# Patient Record
Sex: Female | Born: 1940 | Race: White | Hispanic: No | Marital: Married | Smoking: Current every day smoker
Health system: Southern US, Community
[De-identification: ages and names within clinical notes are randomized; demographics above are authoritative.]

## PROBLEM LIST (undated history)

## (undated) DIAGNOSIS — M1990 Unspecified osteoarthritis, unspecified site: Secondary | ICD-10-CM

## (undated) DIAGNOSIS — E785 Hyperlipidemia, unspecified: Secondary | ICD-10-CM

## (undated) DIAGNOSIS — D249 Benign neoplasm of unspecified breast: Secondary | ICD-10-CM

## (undated) DIAGNOSIS — D369 Benign neoplasm, unspecified site: Secondary | ICD-10-CM

## (undated) DIAGNOSIS — H9192 Unspecified hearing loss, left ear: Secondary | ICD-10-CM

## (undated) DIAGNOSIS — Z789 Other specified health status: Secondary | ICD-10-CM

## (undated) HISTORY — DX: Benign neoplasm, unspecified site: D36.9

## (undated) HISTORY — DX: Hyperlipidemia, unspecified: E78.5

## (undated) HISTORY — DX: Benign neoplasm of unspecified breast: D24.9

## (undated) HISTORY — PX: BREAST EXCISIONAL BIOPSY: SUR124

## (undated) HISTORY — PX: EYE SURGERY: SHX253

## (undated) HISTORY — PX: INNER EAR SURGERY: SHX679

## (undated) HISTORY — DX: Unspecified osteoarthritis, unspecified site: M19.90

---
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
## 1966-04-25 HISTORY — PX: ECTOPIC PREGNANCY SURGERY: SHX613

## 1969-04-25 HISTORY — PX: ABDOMINAL HYSTERECTOMY: SHX81

## 1978-04-25 HISTORY — PX: GANGLION CYST EXCISION: SHX1691

## 1998-02-04 ENCOUNTER — Other Ambulatory Visit: Admission: RE | Admit: 1998-02-04 | Discharge: 1998-02-04 | Payer: Self-pay | Admitting: Obstetrics & Gynecology

## 1999-02-03 ENCOUNTER — Other Ambulatory Visit: Admission: RE | Admit: 1999-02-03 | Discharge: 1999-02-03 | Payer: Self-pay | Admitting: Obstetrics and Gynecology

## 2000-01-06 ENCOUNTER — Ambulatory Visit (HOSPITAL_COMMUNITY): Admission: RE | Admit: 2000-01-06 | Discharge: 2000-01-06 | Payer: Self-pay | Admitting: Gastroenterology

## 2000-02-21 ENCOUNTER — Other Ambulatory Visit: Admission: RE | Admit: 2000-02-21 | Discharge: 2000-02-21 | Payer: Self-pay | Admitting: Obstetrics and Gynecology

## 2001-02-28 ENCOUNTER — Other Ambulatory Visit: Admission: RE | Admit: 2001-02-28 | Discharge: 2001-02-28 | Payer: Self-pay | Admitting: Obstetrics and Gynecology

## 2001-07-10 ENCOUNTER — Encounter: Payer: Self-pay | Admitting: Family Medicine

## 2001-07-10 ENCOUNTER — Ambulatory Visit: Admission: RE | Admit: 2001-07-10 | Discharge: 2001-07-10 | Payer: Self-pay | Admitting: Family Medicine

## 2002-03-08 ENCOUNTER — Other Ambulatory Visit: Admission: RE | Admit: 2002-03-08 | Discharge: 2002-03-08 | Payer: Self-pay | Admitting: Obstetrics and Gynecology

## 2002-12-28 ENCOUNTER — Emergency Department (HOSPITAL_COMMUNITY): Admission: EM | Admit: 2002-12-28 | Discharge: 2002-12-28 | Payer: Self-pay | Admitting: Emergency Medicine

## 2004-03-04 ENCOUNTER — Encounter: Admission: RE | Admit: 2004-03-04 | Discharge: 2004-06-02 | Payer: Self-pay | Admitting: Family Medicine

## 2004-04-12 ENCOUNTER — Ambulatory Visit (HOSPITAL_COMMUNITY): Admission: RE | Admit: 2004-04-12 | Discharge: 2004-04-12 | Payer: Self-pay | Admitting: Obstetrics and Gynecology

## 2005-03-29 ENCOUNTER — Other Ambulatory Visit: Admission: RE | Admit: 2005-03-29 | Discharge: 2005-03-29 | Payer: Self-pay | Admitting: Obstetrics and Gynecology

## 2011-05-01 ENCOUNTER — Ambulatory Visit (INDEPENDENT_AMBULATORY_CARE_PROVIDER_SITE_OTHER): Payer: MEDICARE

## 2011-05-01 DIAGNOSIS — S1093XA Contusion of unspecified part of neck, initial encounter: Secondary | ICD-10-CM

## 2011-05-01 DIAGNOSIS — S0003XA Contusion of scalp, initial encounter: Secondary | ICD-10-CM

## 2011-08-23 ENCOUNTER — Encounter (INDEPENDENT_AMBULATORY_CARE_PROVIDER_SITE_OTHER): Payer: Self-pay | Admitting: Surgery

## 2011-08-23 ENCOUNTER — Ambulatory Visit (INDEPENDENT_AMBULATORY_CARE_PROVIDER_SITE_OTHER): Payer: MEDICARE | Admitting: Surgery

## 2011-08-23 DIAGNOSIS — D369 Benign neoplasm, unspecified site: Secondary | ICD-10-CM | POA: Insufficient documentation

## 2011-08-23 HISTORY — DX: Benign neoplasm, unspecified site: D36.9

## 2011-08-23 NOTE — Progress Notes (Signed)
NAME: Laura Rush DOB: 07/30/1940 MRN: [id number]                                                                                      DATE: 08/23/2011  PCP: HARRIS, WILLIAM, MD, MD Referring Provider: Pavic, Dag, MD  IMPRESSION:  Intraductal papilloma right breast  PLAN:   NL excision. I have discussed the indications for the lumpectomy and described the procedure. She understand that the chance of removal of the abnormal area is very good, but that occasionally we are unable to locate it and may have to do a second procedure. We also discussed the possibility of a second procedure to get additional tissue. Risks of surgery such as bleeding and infection have also been explained, as well as the implications of not doing the surgery. She understands and wishes to proceed.                  CC:  Chief Complaint  Patient presents with  . New Evaluation    Breast papilloma    HPI:  Laura Rush is a 71 y.o.  female who presents for evaluation of She recently had a mammogram and an abnormality was found in the subareolar region on the right. A core biopsy was done and this appears to be a papilloma. Excisional biopsy was recommended.  The patient has never had any problems with her breasts other than some cysts many years ago. She has no current breast symptoms. She's not had any nipple discharge. She has no family history significant for breast cancer.  PMH:  has a past medical history of Papilloma of breast; Arthritis; and Hyperlipidemia.  PSH:   has past surgical history that includes Appendectomy (1949); Tonsillectomy (1949); Abdominal hysterectomy (1971); Inner ear surgery (1970 or 1971); Ganglion cyst excision (1980); and Ectopic pregnancy surgery (1968).  ALLERGIES:   Allergies  Allergen Reactions  . Ampicillin Hives and Rash    All over the body  . Penicillins Hives and Rash    All over the body  . Aspirin Itching    All over the body  . Codeine Other (See Comments)   Numbness of lips  . Red Dye Rash    All over the body  . Sulfur Rash    All over the body    MEDICATIONS: Current outpatient prescriptions:RESTASIS 0.05 % ophthalmic emulsion, Twice daily. Both eyes, Disp: , Rfl:   ROS: Negative except for some arthralgias  EXAM:   GENERAL:  The patient is alert, oriented, and generally healthy-appearing, NAD. Mood and affect are normal.  HEENT:  The head is normocephalic, the eyes nonicteric, the pupils were round regular and equal. EOMs are normal. Pharynx normal. Dentition good.  NECK:  The neck is supple and there are no masses or thyromegaly.  LUNGS: Normal respirations and clear to auscultation.  HEART: Regular rhythm, with no murmurs rubs or gallops. Pulses are intact carotid dorsalis pedis and posterior tibial. No significant varicosities are noted.  BREASTS:  Symmetric. No mass on either side. No nipple discharge. Not tender.  LYMPHATICS No axillary or supraclavicular adenopathy.  ABDOMEN: Soft, flat, and nontender. No masses or organomegaly is   noted. No hernias are noted. Bowel sounds are normal.  EXTREMITIES:  Good range of motion, no edema.   DATA REVIEWED:  Mammogram reports and films and path report noted    Daya Dutt J 08/23/2011  CC: Pavic, Dag, MD, HARRIS, WILLIAM, MD, MD        

## 2011-08-23 NOTE — Patient Instructions (Signed)
We will schedule surgery to remove the small mas - papilloma - from the right nipple area

## 2011-09-05 ENCOUNTER — Encounter (INDEPENDENT_AMBULATORY_CARE_PROVIDER_SITE_OTHER): Payer: Self-pay

## 2011-09-14 ENCOUNTER — Encounter (HOSPITAL_BASED_OUTPATIENT_CLINIC_OR_DEPARTMENT_OTHER): Payer: Self-pay | Admitting: *Deleted

## 2011-09-14 NOTE — Progress Notes (Signed)
Pt's father passed away-has been out of town-no labs needed

## 2011-09-15 ENCOUNTER — Encounter (HOSPITAL_BASED_OUTPATIENT_CLINIC_OR_DEPARTMENT_OTHER)
Admission: RE | Disposition: A | Discharge status: Home / Self Care | Payer: Self-pay | Source: Ambulatory Visit | Attending: Surgery

## 2011-09-15 ENCOUNTER — Encounter (HOSPITAL_BASED_OUTPATIENT_CLINIC_OR_DEPARTMENT_OTHER): Payer: Self-pay | Admitting: Anesthesiology

## 2011-09-15 ENCOUNTER — Ambulatory Visit (HOSPITAL_BASED_OUTPATIENT_CLINIC_OR_DEPARTMENT_OTHER): Payer: Medicare Other | Admitting: Anesthesiology

## 2011-09-15 ENCOUNTER — Ambulatory Visit (HOSPITAL_BASED_OUTPATIENT_CLINIC_OR_DEPARTMENT_OTHER)
Admission: RE | Admit: 2011-09-15 | Discharge: 2011-09-15 | Disposition: A | Discharge status: Home / Self Care | Payer: Medicare Other | Source: Ambulatory Visit | Attending: Surgery | Admitting: Surgery

## 2011-09-15 ENCOUNTER — Encounter (HOSPITAL_BASED_OUTPATIENT_CLINIC_OR_DEPARTMENT_OTHER): Payer: Self-pay

## 2011-09-15 DIAGNOSIS — D249 Benign neoplasm of unspecified breast: Secondary | ICD-10-CM

## 2011-09-15 HISTORY — PX: BREAST BIOPSY: SHX20

## 2011-09-15 HISTORY — DX: Unspecified hearing loss, left ear: H91.92

## 2011-09-15 HISTORY — DX: Other specified health status: Z78.9

## 2011-09-15 LAB — POCT HEMOGLOBIN-HEMACUE: Hemoglobin: 13.4 g/dL (ref 12.0–15.0)

## 2011-09-15 SURGERY — BREAST BIOPSY WITH NEEDLE LOCALIZATION
Anesthesia: General | Site: Breast | Laterality: Right | Wound class: Clean

## 2011-09-15 MED ORDER — LIDOCAINE HCL (CARDIAC) 20 MG/ML IV SOLN
INTRAVENOUS | Status: DC | PRN
  Administered 2011-09-15: 80 mg via INTRAVENOUS

## 2011-09-15 MED ORDER — CHLORHEXIDINE GLUCONATE 4 % EX LIQD: 1.0000 "application " | Freq: Once | CUTANEOUS | Status: DC

## 2011-09-15 MED ORDER — PROPOFOL 10 MG/ML IV EMUL
INTRAVENOUS | Status: DC | PRN
  Administered 2011-09-15: 200 mg via INTRAVENOUS

## 2011-09-15 MED ORDER — LACTATED RINGERS IV SOLN
INTRAVENOUS | Status: DC
  Administered 2011-09-15: 09:00:00 via INTRAVENOUS

## 2011-09-15 MED ORDER — CIPROFLOXACIN IN D5W 400 MG/200ML IV SOLN
400.0000 mg | INTRAVENOUS | Status: AC
  Administered 2011-09-15: 400 mg via INTRAVENOUS

## 2011-09-15 MED ORDER — ONDANSETRON HCL 4 MG/2ML IJ SOLN
INTRAMUSCULAR | Status: DC | PRN
  Administered 2011-09-15: 4 mg via INTRAVENOUS

## 2011-09-15 MED ORDER — METOCLOPRAMIDE HCL 5 MG/ML IJ SOLN: 10.0000 mg | Freq: Once | INTRAMUSCULAR | Status: DC | PRN

## 2011-09-15 MED ORDER — DEXAMETHASONE SODIUM PHOSPHATE 4 MG/ML IJ SOLN
INTRAMUSCULAR | Status: DC | PRN
  Administered 2011-09-15: 10 mg via INTRAVENOUS

## 2011-09-15 MED ORDER — HYDROCODONE-ACETAMINOPHEN 5-325 MG PO TABS: 1.0000 | ORAL_TABLET | ORAL | Status: AC | PRN

## 2011-09-15 MED ORDER — EPHEDRINE SULFATE 50 MG/ML IJ SOLN
INTRAMUSCULAR | Status: DC | PRN
  Administered 2011-09-15: 10 mg via INTRAVENOUS

## 2011-09-15 MED ORDER — ACETAMINOPHEN 10 MG/ML IV SOLN
1000.0000 mg | Freq: Once | INTRAVENOUS | Status: AC
  Administered 2011-09-15: 1000 mg via INTRAVENOUS

## 2011-09-15 MED ORDER — HYDROMORPHONE HCL PF 1 MG/ML IJ SOLN: 0.2500 mg | INTRAMUSCULAR | Status: DC | PRN

## 2011-09-15 MED ORDER — FENTANYL CITRATE 0.05 MG/ML IJ SOLN
INTRAMUSCULAR | Status: DC | PRN
  Administered 2011-09-15: 25 ug via INTRAVENOUS
  Administered 2011-09-15: 50 ug via INTRAVENOUS

## 2011-09-15 MED ORDER — BUPIVACAINE HCL (PF) 0.25 % IJ SOLN
INTRAMUSCULAR | Status: DC | PRN
  Administered 2011-09-15: 20 mL

## 2011-09-15 SURGICAL SUPPLY — 48 items
ADH SKN CLS APL DERMABOND .7 (GAUZE/BANDAGES/DRESSINGS) ×1
APPLICATOR COTTON TIP 6IN STRL (MISCELLANEOUS) IMPLANT
BINDER BREAST LRG (GAUZE/BANDAGES/DRESSINGS) IMPLANT
BINDER BREAST MEDIUM (GAUZE/BANDAGES/DRESSINGS) IMPLANT
BINDER BREAST XLRG (GAUZE/BANDAGES/DRESSINGS) IMPLANT
BINDER BREAST XXLRG (GAUZE/BANDAGES/DRESSINGS) IMPLANT
BLADE HEX COATED 2.75 (ELECTRODE) ×2 IMPLANT
BLADE SURG 15 STRL LF DISP TIS (BLADE) ×1 IMPLANT
BLADE SURG 15 STRL SS (BLADE) ×2
CANISTER SUCTION 1200CC (MISCELLANEOUS) ×2 IMPLANT
CHLORAPREP W/TINT 26ML (MISCELLANEOUS) ×2 IMPLANT
CLIP TI MEDIUM 6 (CLIP) IMPLANT
CLIP TI WIDE RED SMALL 6 (CLIP) IMPLANT
CLOTH BEACON ORANGE TIMEOUT ST (SAFETY) ×2 IMPLANT
COVER MAYO STAND STRL (DRAPES) ×2 IMPLANT
COVER TABLE BACK 60X90 (DRAPES) ×2 IMPLANT
DECANTER SPIKE VIAL GLASS SM (MISCELLANEOUS) IMPLANT
DERMABOND ADVANCED (GAUZE/BANDAGES/DRESSINGS) ×1
DERMABOND ADVANCED .7 DNX12 (GAUZE/BANDAGES/DRESSINGS) ×1 IMPLANT
DEVICE DUBIN W/COMP PLATE 8390 (MISCELLANEOUS) ×1 IMPLANT
DRAPE LAPAROTOMY TRNSV 102X78 (DRAPE) ×2 IMPLANT
DRAPE UTILITY XL STRL (DRAPES) ×2 IMPLANT
ELECT REM PT RETURN 9FT ADLT (ELECTROSURGICAL) ×2
ELECTRODE REM PT RTRN 9FT ADLT (ELECTROSURGICAL) ×1 IMPLANT
GLOVE BIOGEL M STRL SZ7.5 (GLOVE) ×1 IMPLANT
GLOVE BIOGEL PI IND STRL 8 (GLOVE) IMPLANT
GLOVE BIOGEL PI INDICATOR 8 (GLOVE) ×1
GLOVE EUDERMIC 7 POWDERFREE (GLOVE) ×2 IMPLANT
GOWN PREVENTION PLUS XLARGE (GOWN DISPOSABLE) ×2 IMPLANT
KIT MARKER MARGIN INK (KITS) IMPLANT
NDL HYPO 25X1 1.5 SAFETY (NEEDLE) ×1 IMPLANT
NEEDLE HYPO 25X1 1.5 SAFETY (NEEDLE) ×2 IMPLANT
NS IRRIG 1000ML POUR BTL (IV SOLUTION) IMPLANT
PACK BASIN DAY SURGERY FS (CUSTOM PROCEDURE TRAY) ×2 IMPLANT
PENCIL BUTTON HOLSTER BLD 10FT (ELECTRODE) ×2 IMPLANT
SLEEVE SCD COMPRESS KNEE MED (MISCELLANEOUS) ×2 IMPLANT
SPONGE GAUZE 4X4 12PLY (GAUZE/BANDAGES/DRESSINGS) IMPLANT
SPONGE INTESTINAL PEANUT (DISPOSABLE) IMPLANT
SPONGE LAP 4X18 X RAY DECT (DISPOSABLE) ×2 IMPLANT
STAPLER VISISTAT 35W (STAPLE) IMPLANT
SUT MNCRL AB 4-0 PS2 18 (SUTURE) ×2 IMPLANT
SUT SILK 0 TIES 10X30 (SUTURE) IMPLANT
SUT VICRYL 3-0 CR8 SH (SUTURE) ×2 IMPLANT
SYR CONTROL 10ML LL (SYRINGE) ×2 IMPLANT
TOWEL OR NON WOVEN STRL DISP B (DISPOSABLE) ×2 IMPLANT
TUBE CONNECTING 20X1/4 (TUBING) ×2 IMPLANT
WATER STERILE IRR 1000ML POUR (IV SOLUTION) ×1 IMPLANT
YANKAUER SUCT BULB TIP NO VENT (SUCTIONS) ×2 IMPLANT

## 2011-09-15 NOTE — Anesthesia Postprocedure Evaluation (Signed)
Anesthesia Post Note  Patient: Laura Rush  Procedure(s) Performed: Procedure(s) (LRB): BREAST BIOPSY WITH NEEDLE LOCALIZATION (Right)  Anesthesia type: General  Patient location: PACU  Post pain: Pain level controlled  Post assessment: Patient's Cardiovascular Status Stable  Last Vitals:  Filed Vitals:   09/15/11 1216  BP: 149/78  Pulse: 62  Temp: 35.6 C  Resp: 18    Post vital signs: Reviewed and stable  Level of consciousness: alert  Complications: No apparent anesthesia complications

## 2011-09-15 NOTE — Anesthesia Preprocedure Evaluation (Signed)
Anesthesia Evaluation  Patient identified by MRN, date of birth, ID band Patient awake    Reviewed: Allergy & Precautions, H&P , NPO status , Patient's Chart, lab work & pertinent test results, reviewed documented beta blocker date and time   Airway Mallampati: II TM Distance: >3 FB Neck ROM: full    Dental   Pulmonary Current Smoker,          Cardiovascular negative cardio ROS      Neuro/Psych negative neurological ROS  negative psych ROS   GI/Hepatic negative GI ROS, Neg liver ROS,   Endo/Other  negative endocrine ROS  Renal/GU negative Renal ROS  negative genitourinary   Musculoskeletal   Abdominal   Peds  Hematology negative hematology ROS (+)   Anesthesia Other Findings See surgeon's H&P   Reproductive/Obstetrics negative OB ROS                           Anesthesia Physical Anesthesia Plan  ASA: II  Anesthesia Plan: General   Post-op Pain Management:    Induction: Intravenous  Airway Management Planned: LMA  Additional Equipment:   Intra-op Plan:   Post-operative Plan: Extubation in OR  Informed Consent: I have reviewed the patients History and Physical, chart, labs and discussed the procedure including the risks, benefits and alternatives for the proposed anesthesia with the patient or authorized representative who has indicated his/her understanding and acceptance.   Dental Advisory Given  Plan Discussed with: CRNA and Surgeon  Anesthesia Plan Comments:         Anesthesia Quick Evaluation  

## 2011-09-15 NOTE — Interval H&P Note (Signed)
History and Physical Interval Note:  09/15/2011 10:33 AM  Laura Rush  has presented today for surgery, with the diagnosis of right breast mass  The various methods of treatment have been discussed with the patient and family. After consideration of risks, benefits and other options for treatment, the patient has consented to  Procedure(s) (LRB): BREAST BIOPSY WITH NEEDLE LOCALIZATION (Right) as a surgical intervention .  The patients' history has been reviewed, patient examined, no change in status, stable for surgery.  I have reviewed the patients' chart and labs.  Questions were answered to the patient's satisfaction.  The right breast is marked as the operative site and the wire localizing films are reviewed   Mellie Buccellato J

## 2011-09-15 NOTE — Transfer of Care (Signed)
Immediate Anesthesia Transfer of Care Note  Patient: Laura Rush  Procedure(s) Performed: Procedure(s) (LRB): BREAST BIOPSY WITH NEEDLE LOCALIZATION (Right)  Patient Location: PACU  Anesthesia Type: General  Level of Consciousness: awake  Airway & Oxygen Therapy: Patient Spontanous Breathing and Patient connected to face mask oxygen  Post-op Assessment: Report given to PACU RN and Post -op Vital signs reviewed and stable  Post vital signs: Reviewed and stable  Complications: No apparent anesthesia complications

## 2011-09-15 NOTE — H&P (View-Only) (Signed)
Laura Rush DOB: 1940/10/29 MRN: [id number]                                                                                      DATE: 08/23/2011  PCP: Laura Blamer, MD, MD Referring Provider: Dominga Ferry, MD  IMPRESSION:  Intraductal papilloma right breast  PLAN:   NL excision. I have discussed the indications for the lumpectomy and described the procedure. She understand that the chance of removal of the abnormal area is very good, but that occasionally we are unable to locate it and may have to do a second procedure. We also discussed the possibility of a second procedure to get additional tissue. Risks of surgery such as bleeding and infection have also been explained, as well as the implications of not doing the surgery. She understands and wishes to proceed.                  CC:  Chief Complaint  Patient presents with  . New Evaluation    Breast papilloma    HPI:  Laura Rush is a 71 y.o.  female who presents for evaluation of She recently had a mammogram and an abnormality was found in the subareolar region on the right. A core biopsy was done and this appears to be a papilloma. Excisional biopsy was recommended.  The patient has never had any problems with her breasts other than some cysts many years ago. She has no current breast symptoms. She's not had any nipple discharge. She has no family history significant for breast cancer.  PMH:  has a past medical history of Papilloma of breast; Arthritis; and Hyperlipidemia.  PSH:   has past surgical history that includes Appendectomy (1949); Tonsillectomy (1949); Abdominal hysterectomy (1971); Inner ear surgery (1970 or 1971); Ganglion cyst excision (1980); and Ectopic pregnancy surgery (1968).  ALLERGIES:   Allergies  Allergen Reactions  . Ampicillin Hives and Rash    All over the body  . Penicillins Hives and Rash    All over the body  . Aspirin Itching    All over the body  . Codeine Other (See Comments)   Numbness of lips  . Red Dye Rash    All over the body  . Sulfur Rash    All over the body    MEDICATIONS: Current outpatient prescriptions:RESTASIS 0.05 % ophthalmic emulsion, Twice daily. Both eyes, Disp: , Rfl:   ROS: Negative except for some arthralgias  EXAM:   GENERAL:  The patient is alert, oriented, and generally healthy-appearing, NAD. Mood and affect are normal.  HEENT:  The head is normocephalic, the eyes nonicteric, the pupils were round regular and equal. EOMs are normal. Pharynx normal. Dentition good.  NECK:  The neck is supple and there are no masses or thyromegaly.  LUNGS: Normal respirations and clear to auscultation.  HEART: Regular rhythm, with no murmurs rubs or gallops. Pulses are intact carotid dorsalis pedis and posterior tibial. No significant varicosities are noted.  BREASTS:  Symmetric. No mass on either side. No nipple discharge. Not tender.  LYMPHATICS No axillary or supraclavicular adenopathy.  ABDOMEN: Soft, flat, and nontender. No masses or organomegaly is  noted. No hernias are noted. Bowel sounds are normal.  EXTREMITIES:  Good range of motion, no edema.   DATA REVIEWED:  Mammogram reports and films and path report noted    Laura Rush J 08/23/2011  CC: Laura Ferry, MD, Laura Blamer, MD, MD

## 2011-09-15 NOTE — Op Note (Signed)
Laura Rush [birth year] [id number] 08/23/2011  Preoperative diagnosis: Papilloma right breast  Postoperative diagnosis: Same  Procedure: Wire localized excision right breast mass  Surgeon: Currie Paris, MD, FACS  Assistant: None  Anesthesia: General   Clinical History and Indications: This patient had an abnormality found in the right breast subareolar area. A needle core biopsy had shown what appeared to be a papilloma and excisional biopsy was recommended. There is no palpable mass. Guidewire excision was scheduled.    Description of Procedure: I saw the patient a preoperative area and confirmed the plans. I marked the right breast. I reviewed the films.  The patient was taken to the operating room. After satisfactory general anesthesia had been obtained the right breast was prepped and draped and a timeout was done. The guidewire entered about the 3:30 position near the areolar edge and tracked superiorly and laterally. I made a curvilinear incision at the areolar margin. The guidewire was manipulated into the wound. Superficial skin flaps were raised. The tissue around the guidewire was grasped with an Allis clamp and a cylinder of tissue around the guidewire was removed. Specimen mammogram appear to show the clip and mass of the specimen there did appear to be a palpable mass within the tissue removed. I also noted were thought to be some dilated ducts in the vicinity.  Incision was checked for hemostasis when everything was dry I injected 20 cc of 0.25% plain Marcaine to help with postop pain relief. The incision remained dry and it was closed with 3-0 Vicryl, 4-0 Monocryl subcuticular, and Dermabond. The patient tolerated the procedure well. There were no operative complications. All counts were correct.  Currie Paris, MD, FACS 09/15/2011 11:29 AM

## 2011-09-15 NOTE — Anesthesia Procedure Notes (Signed)
Procedure Name: LMA Insertion Performed by: Lance Coon Pre-anesthesia Checklist: Patient identified, Timeout performed, Emergency Drugs available, Suction available and Patient being monitored Patient Re-evaluated:Patient Re-evaluated prior to inductionOxygen Delivery Method: Circle system utilized Intubation Type: IV induction Ventilation: Mask ventilation without difficulty LMA Size: 4.0 Number of attempts: 1 Placement Confirmation: breath sounds checked- equal and bilateral and positive ETCO2 Dental Injury: Teeth and Oropharynx as per pre-operative assessment

## 2011-09-15 NOTE — Discharge Instructions (Signed)
[facility] °[phone] ° ° °BREAST BIOPSY/ PARTIAL MASTECTOMY: POST OP INSTRUCTIONS ° °Always review your discharge instruction sheet given to you by the facility where your surgery was performed. ° °IF YOU HAVE DISABILITY OR FAMILY LEAVE FORMS, YOU MUST BRING THEM TO THE OFFICE FOR PROCESSING.  DO NOT GIVE THEM TO YOUR DOCTOR. ° °1. A prescription for pain medication will be given to you upon discharge.  Take your pain medication as prescribed, as needed.  If narcotic pain medicine is not needed, then you may take ibuprofen (Advil) as needed. °2. Take your usually prescribed medications unless otherwise directed °3. If you need a refill on your pain medication, please contact your pharmacy.  They will contact our office to request authorization.  Prescriptions will not be filled after 5pm or on week-ends. °4. You should eat very light the first 24 hours after surgery, such as soup, crackers, pudding, etc.  Resume your normal diet the day after surgery. °5. Most patients will experience some swelling and bruising in the breast.  Ice packs and a good support bra will help.  Swelling and bruising can take several days to resolve.  °6. It is common to experience some constipation if taking pain medication after surgery.  Increasing fluid intake and taking a stool softener will usually help or prevent this problem from occurring.  A mild laxative (Milk of Magnesia or Miralax) should be taken according to package directions if there are no bowel movements after 48 hours. °7. Unless discharge instructions indicate otherwise, you may remove your bandages 24 hours after surgery, and you may shower at that time.  If your surgeon used skin glue on the incision, you may shower in 24 hours.  The glue will flake off over the next 2-3 weeks. °8. DRAINS:  If you have drain, it is important to keep a list of the amount of drainage produced each day in your drains.  Before leaving the hospital, you should  be instructed on drain care.  Call our office if you have any questions about your drains. BE SURE TO BRING THE RECORD OF THE AMOUNT OF DRAINAGE TO YOUR OFFICE VISITS. We use this to determine when the drains can be removed. °9. ACTIVITIES:  You may resume regular daily activities (gradually increasing) beginning the next day.  Wearing a good support bra or sports bra minimizes pain and swelling.  You may have sexual intercourse when it is comfortable. °a. You may drive when you no longer are taking prescription pain medication, you can comfortably wear a seatbelt, and you can safely maneuver your car and apply brakes. °b. RETURN TO WORK:  ______________________________________________________________________________________ °10. You should see your doctor in the office for a follow-up appointment approximately two weeks after your surgery.  Your doctor’s nurse will typically make your follow-up appointment when she calls you with your pathology report.  Expect your pathology report 2-3 business days after your surgery.  You may call to check if you do not hear from us after three days. °11. OTHER INSTRUCTIONS: _______________________________________________________________________________________________ _____________________________________________________________________________________________________________________________________ °_____________________________________________________________________________________________________________________________________ °_____________________________________________________________________________________________________________________________________ ° °WHEN TO CALL YOUR DOCTOR: °1. Fever over 101.0 °2. Nausea and/or vomiting. °3. Extreme swelling or bruising. °4. Continued bleeding from incision. °5. Increased pain, redness, or drainage from the incision. ° °The clinic staff is available to answer your questions during regular business hours.  Please don’t  hesitate to call and ask to speak to one of the nurses for clinical concerns.  If you have a medical emergency, go   to the nearest emergency room or call 911.  A surgeon from [facility] is always on call at the hospital. ° °[address], [address], [place], [place]  [address] ?  °[address] [address], [place], [place]   [address] °                          [phone] ? [phone] ? FAX [phone] °Web site: [email or web address] ° ° ° °Post Anesthesia Home Care Instructions ° °Activity: °Get plenty of rest for the remainder of the day. A responsible adult should stay with you for 24 hours following the procedure.  °For the next 24 hours, DO NOT: °-Drive a car °-Operate machinery °-Drink alcoholic beverages °-Take any medication unless instructed by your physician °-Make any legal decisions or sign important papers. ° °Meals: °Start with liquid foods such as gelatin or soup. Progress to regular foods as tolerated. Avoid greasy, spicy, heavy foods. If nausea and/or vomiting occur, drink only clear liquids until the nausea and/or vomiting subsides. Call your physician if vomiting continues. ° °Special Instructions/Symptoms: °Your throat may feel dry or sore from the anesthesia or the breathing tube placed in your throat during surgery. If this causes discomfort, gargle with warm salt water. The discomfort should disappear within 24 hours. ° ° °

## 2011-09-20 ENCOUNTER — Telehealth (INDEPENDENT_AMBULATORY_CARE_PROVIDER_SITE_OTHER): Payer: Self-pay

## 2011-09-20 ENCOUNTER — Encounter (HOSPITAL_BASED_OUTPATIENT_CLINIC_OR_DEPARTMENT_OTHER): Payer: Self-pay | Admitting: Surgery

## 2011-09-20 NOTE — Telephone Encounter (Signed)
Pt calling for path result. I did not see final result in epic yet. Please call pt when it is avail. [phone].

## 2011-09-20 NOTE — Telephone Encounter (Signed)
Patient made aware of path results. Will follow up at appt and call with any questions prior.  

## 2011-09-22 ENCOUNTER — Encounter (INDEPENDENT_AMBULATORY_CARE_PROVIDER_SITE_OTHER): Payer: Self-pay | Admitting: Surgery

## 2011-09-22 ENCOUNTER — Ambulatory Visit (INDEPENDENT_AMBULATORY_CARE_PROVIDER_SITE_OTHER): Payer: Medicare Other | Admitting: Surgery

## 2011-09-22 VITALS — BP 145/94 | HR 84 | Temp 98.2°F | Ht 62.0 in | Wt 146.2 lb

## 2011-09-22 DIAGNOSIS — D369 Benign neoplasm, unspecified site: Secondary | ICD-10-CM

## 2011-09-22 NOTE — Progress Notes (Signed)
Chief complaint: Postoperative visit  History of present illness: This patient underwent removal of a right breast mass on 09/15/2011. She is in for her first postoperative visit. She is doing well with no particular problems. She has had minimal pain.  Exam: Vital signs: Gen.: The patient is alert oriented healthy appearing today Breasts: The right breast is healing nicely. There is mild swelling of the tissue but I think this is all postsurgical period there is no evidence of infection or hematoma.  Data reviewed: Pathology confirmed intraductal papilloma completely removed with no atypia.  Impression doing well  Plan: Will see back here p.r.n. gave her a copy of the path report and reviewed with her. Told her she should have annual mammograms.

## 2011-09-22 NOTE — Patient Instructions (Signed)
We will see you again on an as needed basis. Please call the office at [phone] if you have any questions or concerns. Thank you for allowing us to take care of you.  

## 2011-11-10 ENCOUNTER — Encounter (INDEPENDENT_AMBULATORY_CARE_PROVIDER_SITE_OTHER): Payer: Self-pay

## 2013-02-20 ENCOUNTER — Other Ambulatory Visit: Payer: Self-pay | Admitting: Gastroenterology

## 2013-03-15 ENCOUNTER — Other Ambulatory Visit: Payer: Self-pay | Admitting: Family Medicine

## 2013-03-15 ENCOUNTER — Ambulatory Visit
Admission: RE | Admit: 2013-03-15 | Discharge: 2013-03-15 | Disposition: A | Discharge status: Home / Self Care | Payer: Medicare Other | Source: Ambulatory Visit | Attending: Family Medicine | Admitting: Family Medicine

## 2013-03-15 DIAGNOSIS — R05 Cough: Secondary | ICD-10-CM

## 2013-03-15 DIAGNOSIS — R059 Cough, unspecified: Secondary | ICD-10-CM

## 2013-06-19 ENCOUNTER — Other Ambulatory Visit: Payer: Self-pay | Admitting: Family Medicine

## 2014-09-05 ENCOUNTER — Other Ambulatory Visit (HOSPITAL_COMMUNITY)
Admission: RE | Admit: 2014-09-05 | Discharge: 2014-09-05 | Disposition: A | Discharge status: Home / Self Care | Payer: Medicare Other | Source: Ambulatory Visit | Attending: Family Medicine | Admitting: Family Medicine

## 2014-09-05 ENCOUNTER — Other Ambulatory Visit: Payer: Self-pay | Admitting: Family Medicine

## 2014-09-05 DIAGNOSIS — Z124 Encounter for screening for malignant neoplasm of cervix: Secondary | ICD-10-CM | POA: Diagnosis present

## 2014-09-09 LAB — CYTOLOGY - PAP

## 2015-06-25 ENCOUNTER — Other Ambulatory Visit: Payer: Self-pay

## 2015-06-25 DIAGNOSIS — Z1231 Encounter for screening mammogram for malignant neoplasm of breast: Secondary | ICD-10-CM

## 2015-08-20 ENCOUNTER — Ambulatory Visit
Admission: RE | Admit: 2015-08-20 | Discharge: 2015-08-20 | Disposition: A | Discharge status: Home / Self Care | Payer: PPO | Source: Ambulatory Visit

## 2015-08-20 DIAGNOSIS — Z1231 Encounter for screening mammogram for malignant neoplasm of breast: Secondary | ICD-10-CM | POA: Diagnosis not present

## 2015-08-24 DIAGNOSIS — Z9842 Cataract extraction status, left eye: Secondary | ICD-10-CM | POA: Diagnosis not present

## 2015-08-24 DIAGNOSIS — H43813 Vitreous degeneration, bilateral: Secondary | ICD-10-CM | POA: Diagnosis not present

## 2015-08-24 DIAGNOSIS — Z9841 Cataract extraction status, right eye: Secondary | ICD-10-CM | POA: Diagnosis not present

## 2015-08-24 DIAGNOSIS — Z961 Presence of intraocular lens: Secondary | ICD-10-CM | POA: Diagnosis not present

## 2015-08-26 DIAGNOSIS — Z961 Presence of intraocular lens: Secondary | ICD-10-CM | POA: Diagnosis not present

## 2015-08-26 DIAGNOSIS — Z9849 Cataract extraction status, unspecified eye: Secondary | ICD-10-CM | POA: Diagnosis not present

## 2015-08-26 DIAGNOSIS — H43813 Vitreous degeneration, bilateral: Secondary | ICD-10-CM | POA: Diagnosis not present

## 2015-09-10 ENCOUNTER — Other Ambulatory Visit: Payer: Self-pay | Admitting: Family Medicine

## 2015-09-10 DIAGNOSIS — Z0000 Encounter for general adult medical examination without abnormal findings: Secondary | ICD-10-CM | POA: Diagnosis not present

## 2015-09-10 DIAGNOSIS — M8580 Other specified disorders of bone density and structure, unspecified site: Secondary | ICD-10-CM

## 2015-09-10 DIAGNOSIS — H532 Diplopia: Secondary | ICD-10-CM | POA: Diagnosis not present

## 2015-09-10 DIAGNOSIS — E559 Vitamin D deficiency, unspecified: Secondary | ICD-10-CM | POA: Diagnosis not present

## 2015-09-10 DIAGNOSIS — E7800 Pure hypercholesterolemia, unspecified: Secondary | ICD-10-CM | POA: Diagnosis not present

## 2015-09-14 ENCOUNTER — Other Ambulatory Visit (HOSPITAL_COMMUNITY): Payer: Self-pay | Admitting: Family Medicine

## 2015-09-15 ENCOUNTER — Other Ambulatory Visit (HOSPITAL_COMMUNITY): Payer: Self-pay | Admitting: Family Medicine

## 2015-09-15 DIAGNOSIS — H532 Diplopia: Secondary | ICD-10-CM

## 2015-09-17 ENCOUNTER — Ambulatory Visit
Admission: RE | Admit: 2015-09-17 | Discharge: 2015-09-17 | Disposition: A | Discharge status: Home / Self Care | Payer: PPO | Source: Ambulatory Visit | Attending: Family Medicine | Admitting: Family Medicine

## 2015-09-17 DIAGNOSIS — M8580 Other specified disorders of bone density and structure, unspecified site: Secondary | ICD-10-CM

## 2015-09-17 DIAGNOSIS — M85852 Other specified disorders of bone density and structure, left thigh: Secondary | ICD-10-CM | POA: Diagnosis not present

## 2015-09-17 DIAGNOSIS — Z780 Asymptomatic menopausal state: Secondary | ICD-10-CM | POA: Diagnosis not present

## 2015-09-18 ENCOUNTER — Ambulatory Visit
Admission: RE | Admit: 2015-09-18 | Discharge: 2015-09-18 | Disposition: A | Discharge status: Home / Self Care | Payer: PPO | Source: Ambulatory Visit | Attending: Family Medicine | Admitting: Family Medicine

## 2015-09-18 DIAGNOSIS — I63211 Cerebral infarction due to unspecified occlusion or stenosis of right vertebral arteries: Secondary | ICD-10-CM | POA: Diagnosis not present

## 2015-09-18 DIAGNOSIS — I6523 Occlusion and stenosis of bilateral carotid arteries: Secondary | ICD-10-CM | POA: Diagnosis not present

## 2015-09-18 DIAGNOSIS — H532 Diplopia: Secondary | ICD-10-CM

## 2015-09-18 DIAGNOSIS — I63322 Cerebral infarction due to thrombosis of left anterior cerebral artery: Secondary | ICD-10-CM | POA: Diagnosis not present

## 2015-09-24 ENCOUNTER — Ambulatory Visit (INDEPENDENT_AMBULATORY_CARE_PROVIDER_SITE_OTHER): Payer: PPO | Admitting: Neurology

## 2015-09-24 ENCOUNTER — Encounter: Payer: Self-pay | Admitting: Neurology

## 2015-09-24 DIAGNOSIS — M1990 Unspecified osteoarthritis, unspecified site: Secondary | ICD-10-CM | POA: Insufficient documentation

## 2015-09-24 DIAGNOSIS — I6529 Occlusion and stenosis of unspecified carotid artery: Secondary | ICD-10-CM | POA: Insufficient documentation

## 2015-09-24 DIAGNOSIS — F17200 Nicotine dependence, unspecified, uncomplicated: Secondary | ICD-10-CM

## 2015-09-24 DIAGNOSIS — G459 Transient cerebral ischemic attack, unspecified: Secondary | ICD-10-CM | POA: Insufficient documentation

## 2015-09-24 DIAGNOSIS — G450 Vertebro-basilar artery syndrome: Secondary | ICD-10-CM

## 2015-09-24 DIAGNOSIS — I6522 Occlusion and stenosis of left carotid artery: Secondary | ICD-10-CM | POA: Diagnosis not present

## 2015-09-24 DIAGNOSIS — E7800 Pure hypercholesterolemia, unspecified: Secondary | ICD-10-CM | POA: Insufficient documentation

## 2015-09-24 DIAGNOSIS — E785 Hyperlipidemia, unspecified: Secondary | ICD-10-CM

## 2015-09-24 MED ORDER — CHOLESTYRAMINE 4 G PO PACK: 4.0000 g | PACK | Freq: Two times a day (BID) | ORAL | Status: DC

## 2015-09-24 MED FILL — CLOPIDOGREL 75 MG TABLET: 75 | 30 days supply | Qty: 30 | Fill #0

## 2015-09-24 NOTE — Progress Notes (Signed)
[facility]  PATIENT: Laura Rush DOB: 1941/01/24  REFERRING DOCTOR OR PCP:  Jamse Arn   [phone] (fax) SOURCE:  Patient, records from [place]  _________________________________   HISTORICAL  CHIEF COMPLAINT:  Chief Complaint  Patient presents with  . Abnormal MRI    Sts. about 3 weeks ago, she had a brief episode of double vision.  The next day she had another brief episode of double vision.  She saw her opthalmologist who told her eyes were ok.  She saw her pcp and he ordered an mri, which she sts. showed a small stroke. Sts. pcp wanted her to start Plavix, but she is allergic to red dye, which is in both Plavix and Clopidogrel.  Sts. carotid duplex showed plaque bilat, worse on the left.  She has an echo scheduled for tomorrow./fim    HISTORY OF PRESENT ILLNESS:  I had the pleasure seeing you patient, Laura Rush, at [facility] for neurologic consultation regarding her episodes of diplopia and carotid and intracranial stenosis.  As you know, she is a 75 year old woman who reports having two minutes of diplopia one morning, shortly after getting out of bed. Of note, she had an eye exam and a mydriatic agent was used the previous day. She did not note any difficulty with numbness, weakness, speech, headache or other symptoms during the episode. 2 days later, she had a similar 2-3 minute episode of diplopia and sore her optometrist. She was told that the eye exam did not show any relevant abnormality. She saw her primary care practice and had laboratory tests and was referred for MRI of the head, MRA of the head and carotid Doppler studies.  I personally reviewed the MRI studies and concur with the official radiologist reports.  The MRI of the head shows a small subacute stroke in the left splenium that is not in a location that would be expected to cause any diplopia.  There is also moderate chronic microvascular ischemic  change. The MR angiogram shows intracranial stenosis with mild left ICA narrowing, mild left A1 stenosis, moderate left A2 stenosis, moderate M2 stenosis bilaterally and moderate stenosis in the right vertebral artery.   The carotid Doppler showed 50-69% stenosis of the left ICA.  Since then, she reports no further symptoms.  She reports that her primary care doctor had wanted her to stop Plavix but Plavix contains red dye. Her pharmacist told her that they could not order a Plavix that was not red or pink.       She has several other medical issues including chronic renal insufficiency, hyperlipidemia with cholesterol equals 316, LDL equals 227, HDL equals 49 and triglycerides equals 200. She has been tried on many different statins and on WelChol and all of those medications caused her to have myalgias and muscle weakness and she stopped.  Vascular Risks:   Hyperlipidemia.   She currently smokes 2 pack per week.    REVIEW OF SYSTEMS: Constitutional: No fevers, chills, sweats, or change in appetite Eyes: No visual changes, double vision, eye pain Ear, nose and throat: No hearing loss, ear pain, nasal congestion, sore throat Cardiovascular: No chest pain, palpitations Respiratory: No shortness of breath at rest or with exertion.   No wheezes GastrointestinaI: No nausea, vomiting, diarrhea, abdominal pain, fecal incontinence Genitourinary: No dysuria, urinary retention or frequency.  No nocturia. Musculoskeletal: No neck pain, back pain Integumentary: No rash, pruritus, skin lesions Neurological: as above Psychiatric: No depression at this time.  No anxiety Endocrine: No palpitations, diaphoresis, change in appetite, change in weigh or increased thirst Hematologic/Lymphatic: No anemia, purpura, petechiae. Allergic/Immunologic: No itchy/runny eyes, nasal congestion, recent allergic reactions, rashes  ALLERGIES: Allergies  Allergen Reactions  . Ampicillin Hives and Rash    All over the  body  . Penicillins Hives and Rash    All over the body  . Statins Other (See Comments)    muscle aches  . Aspirin Itching    All over the body  . Codeine Other (See Comments)    Numbness of lips  . Red Dye Rash    All over the body  . Sulfur Rash    All over the body    HOME MEDICATIONS:  Current outpatient prescriptions:  .  augmented betamethasone dipropionate (DIPROLENE-AF) 0.05 % cream, Apply topically as needed. , Disp: , Rfl:  .  [facility]S D3 1000 units capsule, Reported on 09/24/2015, Disp: , Rfl: 5  PAST MEDICAL HISTORY: Past Medical History  Diagnosis Date  . Papilloma of breast   . Arthritis   . Hyperlipidemia   . No pertinent past medical history   . HOH (hard of hearing), left   . 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 Path confirmed papilloma     PAST SURGICAL HISTORY: Past Surgical History  Procedure Laterality Date  . Appendectomy  1949  . Tonsillectomy  1949  . Abdominal hysterectomy  1971  . Inner ear surgery  1970 or 1971    patch on ear drum  . Ganglion cyst excision  1980    left wrist  . Ectopic pregnancy surgery  1968  . Eye surgery      cataract lt  . Breast biopsy  09/15/2011    Procedure: BREAST BIOPSY WITH NEEDLE LOCALIZATION;  Surgeon: Haywood Lasso, MD;  Location: [place];  Service: General;  Laterality: Right;    FAMILY HISTORY: Family History  Problem Relation Age of Onset  . Cancer Sister     melanoma - half sister    SOCIAL HISTORY:  Social History   Social History  . Marital Status: Married    Spouse Name: N/A  . Number of Children: N/A  . Years of Education: N/A   Occupational History  . Not on file.   Social History Main Topics  . Smoking status: Current Every Day Smoker -- 0.50 packs/day    Types: Cigarettes  . Smokeless tobacco: Never Used  . Alcohol Use: No  . Drug Use: No  . Sexual Activity: Not on file   Other Topics Concern  . Not on file    Social History Narrative     PHYSICAL EXAM  Filed Vitals:   09/24/15 1524  BP: 144/96  Pulse: 82  Resp: 16  Height: 5' 1.5" (1.562 m)  Weight: 145 lb (65.772 kg)    Body mass index is 26.96 kg/(m^2).   General: The patient is well-developed and well-nourished and in no acute distress  Eyes:  Funduscopic exam shows normal optic discs and retinal vessels.  Neck: The neck is supple, soft left carotid bruit is noted.  The neck is nontender.  Cardiovascular: The heart has a regular rate and rhythm with a normal S1 and S2. There were no murmurs, gallops or rubs. Lungs are clear to auscultation.  Skin: Extremities are without significant edema.  Musculoskeletal:  Back is nontender  Neurologic Exam  Mental status: The patient is alert and oriented  x 3 at the time of the examination. The patient has apparent normal recent and remote memory, with an apparently normal attention span and concentration ability.   Speech is normal.  Cranial nerves: Extraocular movements are full. Pupils are equal, round, and reactive to light and accomodation.  Visual fields are full.  Facial symmetry is present. There is good facial sensation to soft touch bilaterally.Facial strength is normal.  Trapezius and sternocleidomastoid strength is normal. No dysarthria is noted.  The tongue is midline, and the patient has symmetric elevation of the soft palate. No obvious hearing deficits are noted.  Motor:  Muscle bulk is normal.   Tone is normal. Strength is  5 / 5 in all 4 extremities.   Sensory: Sensory testing is intact to pinprick, soft touch and vibration sensation in all 4 extremities.  Coordination: Cerebellar testing reveals good finger-nose-finger and heel-to-shin bilaterally.  Gait and station: Station is normal.   Gait is normal. Tandem gait is mildly wide for age. Romberg is negative.   Reflexes: Deep tendon reflexes are symmetric and normal bilaterally.   Plantar responses are  flexor.    DIAGNOSTIC DATA (LABS, IMAGING, TESTING) - I reviewed patient records, labs, notes, testing and imaging myself where available.  Lab Results  Component Value Date   HGB 13.4 09/15/2011       ASSESSMENT AND PLAN  Vertebrobasilar artery syndrome  Carotid stenosis, left  Hyperlipidemia  Tobacco use disorder     In summary, Laura Rush is a 75 year old woman who had two 2-3 minute episodes of diplopia several weeks ago worrisome for transient ischemic attacks who was found on MRI to have a subacute small stroke who also has a 50-69% left ICA stenosis and mild to moderate intracranial stenosis involving the left ICA, left anterior cerebral artery, bilateral middle cerebral arteries and distal right vertebral artery.   I discussed with her and her husband that these findings increase her risk of stroke and that I would like to place her on an antiplatelet agent to hopefully reduce this risk. She is allergic to many ingredients and drugs including red dyes. Their pharmacy told them that Plavix and generics contain red dye. I was able to find online that one manufacturer (Dr. Reddy[facility]) has a white clopidogrel tablet. I contacted [facility] and they actually have that one in stock. Therefore, I will send her prescription over there.  Additionally she has several stroke risk factors including smoking and hyperlipidemia. We discussed smoking cessation and she is going to stop within the next week or 2. She has not been able to tolerate any of the statin agents due to myalgias and also had myalgias on WelChol which would be unexpected. I printed out a prescription for Questran and advised her to start taking that 2 weeks after she starts the Plavix. That way, if she has any side effects we can try to figure out which medication is more likely to be responsible.  She will return to see me in 2 months and I will want to recheck a carotid ultrasound in about  6 months. She should call sooner if she has new or worsening neurologic symptoms.  Laura Rush A. Felecia Shelling, MD, PhD [id number], [id number] PM Certified in Neurology, Clinical Neurophysiology, Sleep Medicine, Pain Medicine and Neuroimaging  [facility] [address], [place], [place] [address] [phone]

## 2015-09-25 ENCOUNTER — Ambulatory Visit (HOSPITAL_COMMUNITY): Payer: PPO | Attending: Cardiology

## 2015-09-25 DIAGNOSIS — I358 Other nonrheumatic aortic valve disorders: Secondary | ICD-10-CM | POA: Diagnosis not present

## 2015-09-25 DIAGNOSIS — I071 Rheumatic tricuspid insufficiency: Secondary | ICD-10-CM | POA: Diagnosis not present

## 2015-09-25 DIAGNOSIS — H532 Diplopia: Secondary | ICD-10-CM

## 2015-09-25 DIAGNOSIS — I059 Rheumatic mitral valve disease, unspecified: Secondary | ICD-10-CM | POA: Insufficient documentation

## 2015-09-25 DIAGNOSIS — I119 Hypertensive heart disease without heart failure: Secondary | ICD-10-CM | POA: Insufficient documentation

## 2015-09-25 NOTE — Progress Notes (Signed)
TODAY'S OV NOTE HAS BEEN FAXED TO [facility] AT FAX # [phone], WITH FAX CONFIRMATION RECEIVED/fim

## 2015-10-06 ENCOUNTER — Telehealth: Payer: Self-pay | Admitting: Neurology

## 2015-10-06 DIAGNOSIS — N3000 Acute cystitis without hematuria: Secondary | ICD-10-CM | POA: Diagnosis not present

## 2015-10-06 DIAGNOSIS — L821 Other seborrheic keratosis: Secondary | ICD-10-CM | POA: Diagnosis not present

## 2015-10-06 DIAGNOSIS — R350 Frequency of micturition: Secondary | ICD-10-CM | POA: Diagnosis not present

## 2015-10-06 NOTE — Telephone Encounter (Signed)
I have  spoken with Laura Rush this morning.  She had several questions--1--sts. she has sx. of uti--urinary frequency, pressure.  I have advised she f/u with her pcp for u/a to r/o uti.  2--she sts. she has a singular bruise on her right upper back.  No other signs of bleeding.  She will continue to monitor skin for bruising and if she notices these, or delayed healing of present bruise, she will call back.  3--she sts. her son is a Marine scientist in [place] and has advised she will need labwork to eval efficacy of Plavix and wonders if she needs to have labs drawn now.  I have advised RAS will monitor labs at f/u appt's.  She verbalized understanding of same/fim

## 2015-10-06 NOTE — Telephone Encounter (Signed)
Pt called said she has been taking plavix for about 1 week, she has pressure in urinary tract, she has been voiding more each day and now she feels like she is getting a UTI. She is questioning if she take the medication this today, she takes it in the morning. She also said she has a bruise on the rt side of her back but doesn't remember hitting anything. Please call

## 2015-10-11 ENCOUNTER — Telehealth: Payer: Self-pay | Admitting: Neurology

## 2015-10-11 NOTE — Telephone Encounter (Signed)
Pt called reporting that she started to take plavix on 09/25/15 and also started cholestyramine on 10/09/15. However, on 10/10/15 she started to have HA behind left eye but faded away over time. However, the HA came back at night, associated with indigestion, abdominal pain, as well as vomiting. She stated the vomitus was dark brown color. She did not take the two medications today and she would like further advices.   I told her that from her symptoms and the temporal relationship, her symptoms more consistent with side effects from cholestyramine instead of plavix. However, I am not sure if the color in vomitus indicating any GI bleeding from likely gastritis. I suggested her to stop cholestyramine but also hold off plavix for now. If for the next a couple of day, no more vomiting, and stool color is OK, she may consider restart plavix for stroke prevention. She is also advised to call Dr. Felecia Shelling on Monday for further instruction. I will send Dr. Felecia Shelling the note. She expressed understanding and appreciation.  Rosalin Hawking, MD PhD Stroke Neurology 10/11/2015 9:32 AM

## 2015-10-11 NOTE — Telephone Encounter (Signed)
Laura Rush, please see if she is better.    If so, she can restart Plavix (if not yet restarted).   We will keep off of Questran and she should re-address hyperlipidemia with her PCP at her next visit with them

## 2015-10-12 ENCOUNTER — Telehealth: Payer: Self-pay | Admitting: Neurology

## 2015-10-12 NOTE — Telephone Encounter (Signed)
error 

## 2015-10-12 NOTE — Telephone Encounter (Signed)
Patient is returning your call.    Thanks

## 2015-10-12 NOTE — Telephone Encounter (Signed)
I have spoken with Laura Rush this afternoon and per RAS, advised ok to take Plavix 75mg  qod.  She verbalized understanding of same/fim

## 2015-10-12 NOTE — Telephone Encounter (Signed)
Pt called in this morning about her reaction over the weekend. She does not want to restart any medication until she speak with Dr. Felecia Shelling or his nurse. Please call and advise

## 2015-10-12 NOTE — Telephone Encounter (Signed)
[place].  Per RAS, ok to decrease Plavix to one tablet every other day/fim

## 2015-10-30 MED FILL — CLOPIDOGREL 75 MG TABLET: 75 | 30 days supply | Qty: 30 | Fill #1

## 2015-11-03 DIAGNOSIS — G450 Vertebro-basilar artery syndrome: Secondary | ICD-10-CM | POA: Diagnosis not present

## 2015-11-27 ENCOUNTER — Encounter: Payer: Self-pay | Admitting: Neurology

## 2015-11-27 ENCOUNTER — Ambulatory Visit (INDEPENDENT_AMBULATORY_CARE_PROVIDER_SITE_OTHER): Payer: PPO | Admitting: Neurology

## 2015-11-27 VITALS — BP 188/100 | HR 79 | Ht 61.5 in | Wt 146.0 lb

## 2015-11-27 DIAGNOSIS — E785 Hyperlipidemia, unspecified: Secondary | ICD-10-CM | POA: Diagnosis not present

## 2015-11-27 DIAGNOSIS — G450 Vertebro-basilar artery syndrome: Secondary | ICD-10-CM | POA: Diagnosis not present

## 2015-11-27 DIAGNOSIS — I6522 Occlusion and stenosis of left carotid artery: Secondary | ICD-10-CM

## 2015-11-27 NOTE — Progress Notes (Signed)
[facility]  PATIENT: Laura Rush DOB: 1940-12-15  REFERRING DOCTOR OR PCP:  Jamse Arn   [phone] (fax) SOURCE:  Patient, records from [place]  _________________________________   HISTORICAL  CHIEF COMPLAINT:  Chief Complaint  Patient presents with  . Follow-up  . Cerebrovascular Accident    doing well, no double vision.     HISTORY OF PRESENT ILLNESS:  Laura Rush is a 71 woman with  TIA's found to have intracranial stenosis and left ICA stenosis (50-69%).   She also has hyperlipidemia.  She has many medical sensitivities:  ASA  (when she took a high dose of aspirin for back pain years ago, she had a rash after a while); Red dye (in brand name Plavix and many generics, I was able to find her a white Plavix but the entire pill makes her nauseous every third day or so so she is just taking half a pill); Statins and cholesterol resins (myalgias).  She continues on one half Plavix daily and we discussed that although a full pill would be optimal one half Plavix still has some benefit. [place] to have her add a baby aspirin which would probably be more effective due to her reaction in the past.   I would like her to get her under better control but she has tentative to every medication tried. She continues to smoke and cessation is recommended.  History of TIA:   In May, 2017, she had two minutes of diplopia one morning, shortly after getting out of bed. Of note, she had an eye exam and a mydriatic agent was used the previous day. She did not note any difficulty with numbness, weakness, speech, headache or other symptoms during the episode. 2 days later, she had a similar 2-3 minute episode of diplopia and sore her optometrist. She was told that the eye exam did not show any relevant abnormality. She saw her primary care practice and had laboratory tests and was referred for MRI of the head, MRA of the head and carotid Doppler studies abnormalities.       Studies:   I personally reviewed the MRI images in Mr. and Mrs. Cawley presence.    The MRI of the head shows a small subacute stroke in the left splenium that is not in a location that would be expected to cause any diplopia.  There is also moderate chronic microvascular ischemic change. The MR angiogram shows intracranial stenosis with mild left ICA narrowing, mild left A1 stenosis, moderate left A2 stenosis, moderate M2 stenosis bilaterally and moderate stenosis in the right vertebral artery.   Also, the carotid Doppler showed 50-69% stenosis of the left ICA.  She has several other medical issues including chronic renal insufficiency, hyperlipidemia with cholesterol equals 316, LDL equals 227, HDL equals 49 and triglycerides equals 200.   Vascular Risks:   Hyperlipidemia.   She currently smokes 2 pack per week.    REVIEW OF SYSTEMS: Constitutional: No fevers, chills, sweats, or change in appetite Eyes: No visual changes, double vision, eye pain Ear, nose and throat: No hearing loss, ear pain, nasal congestion, sore throat Cardiovascular: No chest pain, palpitations Respiratory: No shortness of breath at rest or with exertion.   No wheezes GastrointestinaI: No nausea, vomiting, diarrhea, abdominal pain, fecal incontinence Genitourinary: No dysuria, urinary retention or frequency.  No nocturia. Musculoskeletal: No neck pain, back pain Integumentary: No rash, pruritus, skin lesions Neurological: as above Psychiatric: No depression at this time.  No anxiety Endocrine: No  palpitations, diaphoresis, change in appetite, change in weigh or increased thirst Hematologic/Lymphatic: No anemia, purpura, petechiae. Allergic/Immunologic: No itchy/runny eyes, nasal congestion, recent allergic reactions, rashes  ALLERGIES: Allergies  Allergen Reactions  . Ampicillin Hives and Rash    All over the body  . Penicillins Hives and Rash    All over the body  . Statins Other (See Comments)    muscle  aches  . Aspirin Itching    All over the body  . Codeine Other (See Comments)    Numbness of lips  . Red Dye Rash    All over the body  . Sulfur Rash    All over the body    HOME MEDICATIONS:  Current Outpatient Prescriptions:  .  augmented betamethasone dipropionate (DIPROLENE-AF) 0.05 % cream, Apply topically as needed. , Disp: , Rfl:  .  CVS D3 1000 units capsule, Reported on 09/24/2015, Disp: , Rfl: 5  PAST MEDICAL HISTORY: Past Medical History:  Diagnosis Date  . Arthritis   . HOH (hard of hearing), left   . Hyperlipidemia   . 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 Path confirmed papilloma   . No pertinent past medical history   . Papilloma of breast     PAST SURGICAL HISTORY: Past Surgical History:  Procedure Laterality Date  . ABDOMINAL HYSTERECTOMY  1971  . APPENDECTOMY  1949  . BREAST BIOPSY  09/15/2011   Procedure: BREAST BIOPSY WITH NEEDLE LOCALIZATION;  Surgeon: Haywood Lasso, MD;  Location: [facility];  Service: General;  Laterality: Right;  . [place]  . EYE SURGERY     cataract lt  . GANGLION CYST EXCISION  1980   left wrist  . INNER EAR SURGERY  1970 or 1971   patch on ear drum  . TONSILLECTOMY  1949    FAMILY HISTORY: Family History  Problem Relation Age of Onset  . Cancer Sister     melanoma - half sister    SOCIAL HISTORY:  Social History   Social History  . Marital status: Married    Spouse name: N/A  . Number of children: N/A  . Years of education: N/A   Occupational History  . Not on file.   Social History Main Topics  . Smoking status: Current Every Day Smoker    Packs/day: 0.50    Types: Cigarettes  . Smokeless tobacco: Never Used  . Alcohol use No  . Drug use: No  . Sexual activity: Not on file   Other Topics Concern  . Not on file   Social History Narrative  . No narrative on file     PHYSICAL EXAM  Vitals:   11/27/15 1058  BP:  (!) 188/100  Pulse: 79  Weight: 146 lb (66.2 kg)  Height: 5' 1.5" (1.562 m)    Body mass index is 27.14 kg/m.   General: The patient is well-developed and well-nourished and in no acute distress   Neck: The neck is supple, soft left carotid bruit is noted.  The neck is nontender.  Cardiovascular: The heart has a regular rate and rhythm with a normal S1 and S2. There were no murmurs, gallops or rubs.    Skin:   No bruising on arms  Neurologic Exam  Mental status: The patient is alert and oriented x 3 at the time of the examination. The patient has apparent normal recent and remote memory, with an apparently normal attention span  and concentration ability.   Speech is normal.  Cranial nerves: Extraocular movements are full.  There is good facial sensation to soft touch bilaterally.Facial strength is normal.  Trapezius and sternocleidomastoid strength is normal. No dysarthria is noted.  The tongue is midline, and the patient has symmetric elevation of the soft palate. No obvious hearing deficits are noted.  Motor:  Muscle bulk is normal.   Tone is normal. Strength is  5 / 5 in all 4 extremities.   Sensory: Sensory testing is intact to touch and vibration sensation in all 4 extremities.  Coordination: Cerebellar testing reveals good finger-nose-finger and heel-to-shin bilaterally.  Gait and station: Station is normal.   Gait is normal. Tandem gait is mildly wide for age. Romberg is negative.   Reflexes: Deep tendon reflexes are symmetric and normal bilaterally.       DIAGNOSTIC DATA (LABS, IMAGING, TESTING) - I reviewed patient records, labs, notes, testing and imaging myself where available.  Lab Results  Component Value Date   HGB 13.4 09/15/2011       ASSESSMENT AND PLAN  Vertebrobasilar artery syndrome - Plan: BMP with eGFR, Basic metabolic panel  Carotid stenosis, left - Plan: BMP with eGFR, Basic metabolic panel  Hyperlipidemia - Plan: BMP with eGFR, Basic  metabolic panel   1.     Continue one half Plavix. If she has another event, we would need to consider adding an aspirin (though I am reluctant due to her prior reaction) or dipyridamole or other agent.    We will check a genetic marker (P2Y12) --- if abnormal, Pletal or dipyridamole 2.    I advised her to discuss her hyperlipidemia further Dr. Kenton Kingfisher. Any treatment might be useful for stroke prevention.   Smoking cessation would help also.  3.    We will send her labs to Dr. Kenton Kingfisher  She will return to see me in 4-5 months and we will want to recheck a carotid ultrasound around that time  She should call sooner if she has new or worsening neurologic symptoms.  Avondre Richens A. Felecia Shelling, MD, PhD 11/29/2156, 72:76 AM Certified in Neurology, Clinical Neurophysiology, Sleep Medicine, Pain Medicine and Neuroimaging  [facility] [address] [place], [place] [address] [phone]

## 2015-12-04 ENCOUNTER — Telehealth: Payer: Self-pay | Admitting: *Deleted

## 2015-12-04 LAB — BASIC METABOLIC PANEL
BUN/Creatinine Ratio: 16 (ref 12–28)
BUN: 19 mg/dL (ref 8–27)
CALCIUM: 9.9 mg/dL (ref 8.7–10.3)
CHLORIDE: 100 mmol/L (ref 96–106)
CO2: 25 mmol/L (ref 18–29)
Creatinine, Ser: 1.19 mg/dL — ABNORMAL HIGH (ref 0.57–1.00)
GFR calc non Af Amer: 46 mL/min/{1.73_m2} — ABNORMAL LOW (ref >59)
GFR, EST AFRICAN AMERICAN: 53 mL/min/{1.73_m2} — AB (ref >59)
Glucose: 100 mg/dL — ABNORMAL HIGH (ref 65–99)
POTASSIUM: 5.6 mmol/L — AB (ref 3.5–5.2)
Sodium: 146 mmol/L — ABNORMAL HIGH (ref 134–144)

## 2015-12-04 LAB — CYP450 2C19 MUTATIONS: 2C19 Metabolic Activity:: NORMAL

## 2015-12-04 NOTE — Telephone Encounter (Signed)
I have spoken with Laura Rush this afternoon, and per RAS, advised that Plavix metabolism test was ok and kidney labs reflected known mild renal insufficiency.  Pt. verbalized understanding of same, and per her request, copy of labs mailed to her home address/fim

## 2015-12-04 NOTE — Telephone Encounter (Signed)
-----   Message from Britt Bottom, MD sent at 12/04/2015  2:28 PM EDT ----- Please let them know that the Plavix metabolism lab test was normal. Kidney tests shows that her creatinine is slightly increased (1.19) consistent with her known mild renal insufficiency

## 2015-12-24 MED FILL — CLOPIDOGREL 75 MG TABLET: 75 | 30 days supply | Qty: 30 | Fill #2

## 2016-01-27 DIAGNOSIS — L820 Inflamed seborrheic keratosis: Secondary | ICD-10-CM | POA: Diagnosis not present

## 2016-01-27 DIAGNOSIS — M8580 Other specified disorders of bone density and structure, unspecified site: Secondary | ICD-10-CM | POA: Diagnosis not present

## 2016-02-15 MED FILL — CLOPIDOGREL 75 MG TABLET: 75 | 30 days supply | Qty: 30 | Fill #3

## 2016-03-03 DIAGNOSIS — L820 Inflamed seborrheic keratosis: Secondary | ICD-10-CM | POA: Diagnosis not present

## 2016-04-11 MED FILL — CLOPIDOGREL 75 MG TABLET: 75 | 30 days supply | Qty: 30 | Fill #4

## 2016-04-25 HISTORY — PX: BREAST BIOPSY: SHX20

## 2016-06-10 MED FILL — CLOPIDOGREL 75 MG TABLET: 75 | 30 days supply | Qty: 30 | Fill #5

## 2016-07-11 ENCOUNTER — Other Ambulatory Visit: Payer: Self-pay | Admitting: Family Medicine

## 2016-07-11 DIAGNOSIS — Z1231 Encounter for screening mammogram for malignant neoplasm of breast: Secondary | ICD-10-CM

## 2016-08-19 MED FILL — CLOPIDOGREL 75 MG TABLET: 75 | 30 days supply | Qty: 30 | Fill #6

## 2016-08-22 ENCOUNTER — Ambulatory Visit
Admission: RE | Admit: 2016-08-22 | Discharge: 2016-08-22 | Disposition: A | Discharge status: Home / Self Care | Payer: PPO | Source: Ambulatory Visit | Attending: Family Medicine | Admitting: Family Medicine

## 2016-08-22 DIAGNOSIS — Z1231 Encounter for screening mammogram for malignant neoplasm of breast: Secondary | ICD-10-CM | POA: Diagnosis not present

## 2016-08-23 ENCOUNTER — Other Ambulatory Visit: Payer: Self-pay | Admitting: Family Medicine

## 2016-08-23 DIAGNOSIS — R928 Other abnormal and inconclusive findings on diagnostic imaging of breast: Secondary | ICD-10-CM

## 2016-08-25 ENCOUNTER — Other Ambulatory Visit: Payer: Self-pay | Admitting: Family Medicine

## 2016-08-25 ENCOUNTER — Ambulatory Visit
Admission: RE | Admit: 2016-08-25 | Discharge: 2016-08-25 | Disposition: A | Discharge status: Home / Self Care | Payer: PPO | Source: Ambulatory Visit | Attending: Family Medicine | Admitting: Family Medicine

## 2016-08-25 DIAGNOSIS — N6311 Unspecified lump in the right breast, upper outer quadrant: Secondary | ICD-10-CM | POA: Diagnosis not present

## 2016-08-25 DIAGNOSIS — R928 Other abnormal and inconclusive findings on diagnostic imaging of breast: Secondary | ICD-10-CM

## 2016-08-25 DIAGNOSIS — N6310 Unspecified lump in the right breast, unspecified quadrant: Secondary | ICD-10-CM

## 2016-08-29 ENCOUNTER — Ambulatory Visit
Admission: RE | Admit: 2016-08-29 | Discharge: 2016-08-29 | Disposition: A | Discharge status: Home / Self Care | Payer: PPO | Source: Ambulatory Visit | Attending: Family Medicine | Admitting: Family Medicine

## 2016-08-29 ENCOUNTER — Other Ambulatory Visit: Payer: Self-pay | Admitting: Family Medicine

## 2016-08-29 DIAGNOSIS — N6011 Diffuse cystic mastopathy of right breast: Secondary | ICD-10-CM | POA: Diagnosis not present

## 2016-08-29 DIAGNOSIS — N6310 Unspecified lump in the right breast, unspecified quadrant: Secondary | ICD-10-CM

## 2016-08-29 DIAGNOSIS — N6311 Unspecified lump in the right breast, upper outer quadrant: Secondary | ICD-10-CM | POA: Diagnosis not present

## 2016-09-12 DIAGNOSIS — E559 Vitamin D deficiency, unspecified: Secondary | ICD-10-CM | POA: Diagnosis not present

## 2016-09-12 DIAGNOSIS — G450 Vertebro-basilar artery syndrome: Secondary | ICD-10-CM | POA: Diagnosis not present

## 2016-09-12 DIAGNOSIS — E7800 Pure hypercholesterolemia, unspecified: Secondary | ICD-10-CM | POA: Diagnosis not present

## 2016-09-12 DIAGNOSIS — Z0000 Encounter for general adult medical examination without abnormal findings: Secondary | ICD-10-CM | POA: Diagnosis not present

## 2016-09-12 DIAGNOSIS — M8580 Other specified disorders of bone density and structure, unspecified site: Secondary | ICD-10-CM | POA: Diagnosis not present

## 2016-09-12 DIAGNOSIS — N183 Chronic kidney disease, stage 3 (moderate): Secondary | ICD-10-CM | POA: Diagnosis not present

## 2016-09-20 MED FILL — CLOPIDOGREL 75 MG TABLET: 75 | 30 days supply | Qty: 30 | Fill #7

## 2016-09-21 ENCOUNTER — Encounter: Payer: Self-pay | Admitting: Neurology

## 2016-09-29 ENCOUNTER — Ambulatory Visit (INDEPENDENT_AMBULATORY_CARE_PROVIDER_SITE_OTHER): Payer: PPO | Admitting: Neurology

## 2016-09-29 ENCOUNTER — Encounter: Payer: Self-pay | Admitting: Neurology

## 2016-09-29 VITALS — BP 140/90 | HR 100 | Ht 61.5 in | Wt 146.5 lb

## 2016-09-29 DIAGNOSIS — E785 Hyperlipidemia, unspecified: Secondary | ICD-10-CM | POA: Diagnosis not present

## 2016-09-29 DIAGNOSIS — I679 Cerebrovascular disease, unspecified: Secondary | ICD-10-CM

## 2016-09-29 DIAGNOSIS — I779 Disorder of arteries and arterioles, unspecified: Secondary | ICD-10-CM | POA: Diagnosis not present

## 2016-09-29 DIAGNOSIS — F17210 Nicotine dependence, cigarettes, uncomplicated: Secondary | ICD-10-CM | POA: Diagnosis not present

## 2016-09-29 DIAGNOSIS — I739 Peripheral vascular disease, unspecified: Secondary | ICD-10-CM

## 2016-09-29 DIAGNOSIS — G450 Vertebro-basilar artery syndrome: Secondary | ICD-10-CM | POA: Diagnosis not present

## 2016-09-29 NOTE — Patient Instructions (Signed)
1.  Try to start taking one full strength Plavix (75mg ) daily 2.  Ideally, we would want you on a cholesterol-lowering medication to lower the LDL 3.  Try to continue working on to quit smoking 4.  We will check another carotid ultrasound.

## 2016-09-29 NOTE — Progress Notes (Signed)
NEUROLOGY CONSULTATION NOTE  Laura Rush MRN: [id number] DOB: Jul 06, 1940  Referring provider: Dr. Kenton Kingfisher Primary care provider: Dr. Kenton Kingfisher  Reason for consult:  TIA and carotid artery disease.  HISTORY OF PRESENT ILLNESS: Laura Rush is a 76 year old right-handed female with hyperlipidemia, chronic renal insufficiency, tobacco smoker and history of TIAs presents for second opinion regarding carotid stenosis.  She is accompanied by her husband who supplements history.  In May 2017, she developed vertical diplopia lasting several seconds.  There was no associated headache, slurred speech, dizziness or unilateral numbness or weakness.  She had another brief episode the following day.  MRI of brain from 09/18/15 was personally reviewed and revealed a small subacute ischemic infarct in the left splenium, as well as moderate chronic small vessel ischemic changes.  MRA of the head revealed intracranial stenosis with mild left ICA narrowing, mild left A1 stenosis, moderate left A2 stenosis, moderate bilateral M2 stenosis, and moderate right distal vertebral artery stenosis with dominant left vertebral artery.  The basilar artery was patent.  Carotid doppler demonstrated 50-69% stenosis of the left ICA.  She has history of medication sensitivity.  She cannot take ASA due to severe allergic reaction of hives.  She was started on Plavix but was unable to tolerate a full dose.  She started by taking 1/2 tablet every other day, and currently takes 1/2 tablet alternating with 1 full tablet.  Her goal is to eventually increase to 1 full tablet daily.  She has been unable to tolerate almost every statin.  She smokes 6 cigarettes daily.  She walks daily.  PAST MEDICAL HISTORY: Past Medical History:  Diagnosis Date  . Arthritis   . HOH (hard of hearing), left   . Hyperlipidemia   . 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 Path confirmed papilloma   . No pertinent  past medical history   . Papilloma of breast     PAST SURGICAL HISTORY: Past Surgical History:  Procedure Laterality Date  . ABDOMINAL HYSTERECTOMY  1971  . APPENDECTOMY  1949  . BREAST BIOPSY  09/15/2011   Procedure: BREAST BIOPSY WITH NEEDLE LOCALIZATION;  Surgeon: Haywood Lasso, MD;  Location: [facility];  Service: General;  Laterality: Right;  . BREAST EXCISIONAL BIOPSY     right excisional biopsy  . [place]  . EYE SURGERY     cataract lt  . GANGLION CYST EXCISION  1980   left wrist  . INNER EAR SURGERY  1970 or 1971   patch on ear drum  . TONSILLECTOMY  1949    MEDICATIONS: Current Outpatient Prescriptions on File Prior to Visit  Medication Sig Dispense Refill  . augmented betamethasone dipropionate (DIPROLENE-AF) 0.05 % cream Apply topically as needed.     . clopidogrel (PLAVIX) 75 MG tablet Take by mouth daily. Taking 1/2 tablet daily    . [facility] D3 1000 units capsule Reported on 09/24/2015  5   No current facility-administered medications on file prior to visit.     ALLERGIES: Allergies  Allergen Reactions  . Ampicillin Hives and Rash    All over the body  . Penicillins Hives and Rash    All over the body  . Statins Other (See Comments)    muscle aches  . Aspirin Itching    All over the body  . Codeine Other (See Comments)    Numbness of lips  . Red Dye Rash  All over the body  . Sulfur Rash    All over the body    FAMILY HISTORY: Family History  Problem Relation Age of Onset  . Cancer Sister        melanoma - half sister    SOCIAL HISTORY: Social History   Social History  . Marital status: Married    Spouse name: N/A  . Number of children: 2  . Years of education: 12   Occupational History  . Not on file.   Social History Main Topics  . Smoking status: Current Every Day Smoker    Packs/day: 0.50    Types: Cigarettes  . Smokeless tobacco: Never Used  . Alcohol use No  . Drug use: No  . Sexual  activity: Not on file   Other Topics Concern  . Not on file   Social History Narrative   Lives with husband in a 2 story home.  Has 2 children.  Retired Agricultural engineer.  Education: high school.     REVIEW OF SYSTEMS: Constitutional: No fevers, chills, or sweats, no generalized fatigue, change in appetite Eyes: No visual changes, double vision, eye pain Ear, nose and throat: No hearing loss, ear pain, nasal congestion, sore throat Cardiovascular: No chest pain, palpitations Respiratory:  No shortness of breath at rest or with exertion, wheezes GastrointestinaI: No nausea, vomiting, diarrhea, abdominal pain, fecal incontinence Genitourinary:  No dysuria, urinary retention or frequency Musculoskeletal:  No neck pain, back pain Integumentary: No rash, pruritus, skin lesions Neurological: as above Psychiatric: No depression, insomnia, anxiety Endocrine: No palpitations, fatigue, diaphoresis, mood swings, change in appetite, change in weight, increased thirst Hematologic/Lymphatic:  No purpura, petechiae. Allergic/Immunologic: no itchy/runny eyes, nasal congestion, recent allergic reactions, rashes  PHYSICAL EXAM: Vitals:   09/29/16 1553  BP: 140/90  Pulse: 100   General: No acute distress.  Patient appears well-groomed.  Head:  Normocephalic/atraumatic Eyes:  fundi examined but not visualized Neck: supple, no paraspinal tenderness, full range of motion Back: No paraspinal tenderness Heart: regular rate and rhythm Lungs: Clear to auscultation bilaterally. Vascular: No carotid bruits. Neurological Exam: Mental status: alert and oriented to person, place, and time, recent and remote memory intact, fund of knowledge intact, attention and concentration intact, speech fluent and not dysarthric, language intact. Cranial nerves: CN I: not tested CN II: pupils equal, round and reactive to light, visual fields intact CN III, IV, VI:  full range of motion, no nystagmus, no ptosis CN V: facial  sensation intact CN VII: upper and lower face symmetric CN VIII: hearing intact CN IX, X: gag intact, uvula midline CN XI: sternocleidomastoid and trapezius muscles intact CN XII: tongue midline Bulk & Tone: normal, no fasciculations. Motor:  5/5 throughout  Sensation: temperature and vibration sensation intact. Deep Tendon Reflexes:  2+ throughout, toes downgoing.  Finger to nose testing:  Without dysmetria.  Heel to shin:  Without dysmetria.  Gait:  Normal station and stride.  Able to turn and tandem walk. Romberg negative.  IMPRESSION: 1.  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  MRI finding of small subacute infarct was an incidental finding and doesn't correspond to patient's symptoms. 2.  Cerebrovascular disease 3.  Left sided carotid artery disease 4.  Tobacco smoker 5.  Hyperlipidemia   PLAN: 1.  Ideally, she should try to take Plavix 75mg  daily for secondary stroke prevention. 2.  Ideally, she should be on a statin, but cannot tolerate it.  LDL goal should be less than 70. 3.  Discussed importance  of smoking cessation 4.  We will repeat carotid doppler to evaluate for stability or progression of disease. 5.  Continue exercise (daily walks), optimize blood pressure control, Mediterranean diet. 6. Follow up as needed.  Thank you for allowing me to take part in the care of this patient.  Metta Clines, DO  CC:  Shirline Frees, MD

## 2016-10-17 ENCOUNTER — Telehealth: Payer: Self-pay | Admitting: Neurology

## 2016-10-17 NOTE — Telephone Encounter (Signed)
PT said she is supposed to have an ultrasound on her neck and she has not heard from anyone about scheduling that

## 2016-10-18 NOTE — Telephone Encounter (Signed)
Called [facility] department and scheduled Carotid u/s at [facility] on 11/01/2016 at 10:00am with a 9:45am arrival.  Called patient and gave her this information.  Patient acknowledged and understood.

## 2016-11-01 ENCOUNTER — Encounter (HOSPITAL_COMMUNITY): Payer: Self-pay

## 2016-11-01 ENCOUNTER — Ambulatory Visit (HOSPITAL_BASED_OUTPATIENT_CLINIC_OR_DEPARTMENT_OTHER)
Admission: RE | Admit: 2016-11-01 | Discharge: 2016-11-01 | Disposition: A | Discharge status: Home / Self Care | Payer: PPO | Source: Ambulatory Visit | Attending: Neurology | Admitting: Neurology

## 2016-11-01 ENCOUNTER — Other Ambulatory Visit: Payer: Self-pay | Admitting: Neurology

## 2016-11-01 ENCOUNTER — Ambulatory Visit (HOSPITAL_COMMUNITY)
Admission: RE | Admit: 2016-11-01 | Discharge: 2016-11-01 | Disposition: A | Discharge status: Home / Self Care | Payer: PPO | Source: Ambulatory Visit | Attending: Neurology | Admitting: Neurology

## 2016-11-01 DIAGNOSIS — I6523 Occlusion and stenosis of bilateral carotid arteries: Secondary | ICD-10-CM | POA: Diagnosis not present

## 2016-11-01 DIAGNOSIS — F17210 Nicotine dependence, cigarettes, uncomplicated: Secondary | ICD-10-CM | POA: Insufficient documentation

## 2016-11-01 DIAGNOSIS — I651 Occlusion and stenosis of basilar artery: Secondary | ICD-10-CM | POA: Diagnosis not present

## 2016-11-01 DIAGNOSIS — I6509 Occlusion and stenosis of unspecified vertebral artery: Secondary | ICD-10-CM

## 2016-11-01 DIAGNOSIS — G450 Vertebro-basilar artery syndrome: Secondary | ICD-10-CM

## 2016-11-01 DIAGNOSIS — E785 Hyperlipidemia, unspecified: Secondary | ICD-10-CM

## 2016-11-01 DIAGNOSIS — I739 Peripheral vascular disease, unspecified: Secondary | ICD-10-CM

## 2016-11-01 DIAGNOSIS — I779 Disorder of arteries and arterioles, unspecified: Secondary | ICD-10-CM

## 2016-11-01 DIAGNOSIS — I679 Cerebrovascular disease, unspecified: Secondary | ICD-10-CM

## 2016-11-01 NOTE — Progress Notes (Signed)
*  PRELIMINARY RESULTS* Vascular Ultrasound Carotid Duplex (Doppler) has been completed.  Preliminary findings: Right 1-39% ICA stenosis, Left 40-59% proximal internal carotid artery stenosis,  antegrade vertebral flow.   Everrett Coombe 11/01/2016, 10:39 AM

## 2016-11-02 MED FILL — CLOPIDOGREL 75 MG TABLET: 75 | 90 days supply | Qty: 90 | Fill #0

## 2016-11-07 ENCOUNTER — Telehealth: Payer: Self-pay | Admitting: Neurology

## 2016-11-07 NOTE — Telephone Encounter (Signed)
Spoke to patient. Confirmed Carotid Doppler results sent to PCP. Patient requested results mailed to her b/c unable to get online for MyChart. Verified address. Sent results.

## 2016-11-07 NOTE — Telephone Encounter (Signed)
Laura Rush Self [phone]  [name] called to see if the results from her recent test had been sent to her PCP, plus she would like a copy mailed to her.

## 2016-11-23 IMAGING — MR MR HEAD W/O CM
11 series · 40 of 48 positions shown · non-contrast
Comparison: None.

CLINICAL DATA: Two episodes of diplopia following routine eye exam.
Each episode lasted a few minutes. No residual symptoms.

EXAM:
MRI HEAD WITHOUT CONTRAST
MRA HEAD WITHOUT CONTRAST
TECHNIQUE: Multiplanar, multiecho pulse sequences of the brain and surrounding
structures were obtained without intravenous contrast. Angiographic
images of the head were obtained using MRA technique without
contrast.

[Series 2: T1 · sagittal · 5.0mm · 0.45mm/px · 1 of 21 slices shown]
[im 1/21]
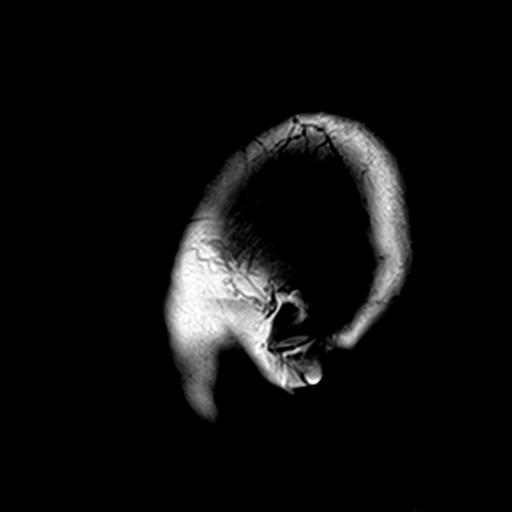

[Series 3: DWI · axial · 3.0mm · 1.80mm/px · z∈[-16,+130]mm · 6 of 99 slices shown (1 of 4)]
[im 1/99]
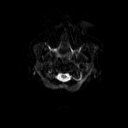
[im 20/99]
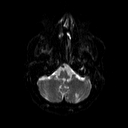
[im 40/99]
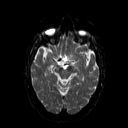
[im 59/99]
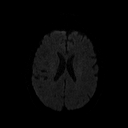
[im 79/99]
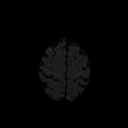
[im 99/99]
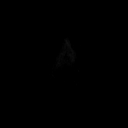

[Series 4: DWI · axial · 3.0mm · 1.80mm/px · z∈[-16,+130]mm · 4 of 49 slices shown (2 of 4)]
[im 1/49]
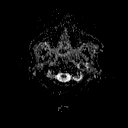
[im 17/49]
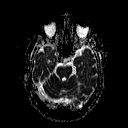
[im 33/49]
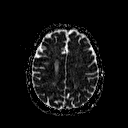
[im 49/49]
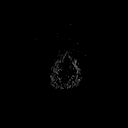

[Series 6: swi_images · axial · 2.0mm · 0.90mm/px · z∈[-21,+136]mm · 6 of 80 slices shown]
[im 1/80]
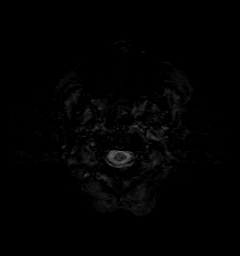
[im 16/80]
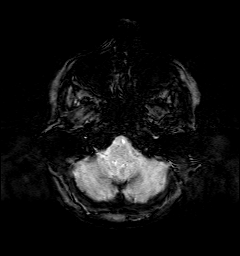
[im 32/80]
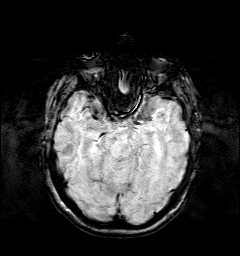
[im 48/80]
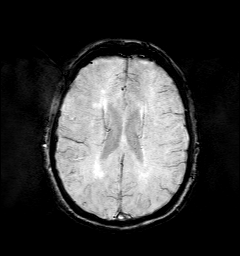
[im 64/80]
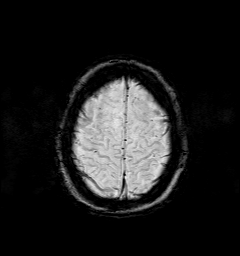
[im 80/80]
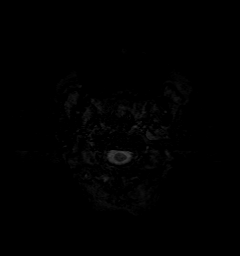

[Series 8: tof_3d_multi-slab · axial · 0.7mm · 0.35mm/px · z∈[-21,+90]mm · 8 of 162 slices shown]
[im 1/162]
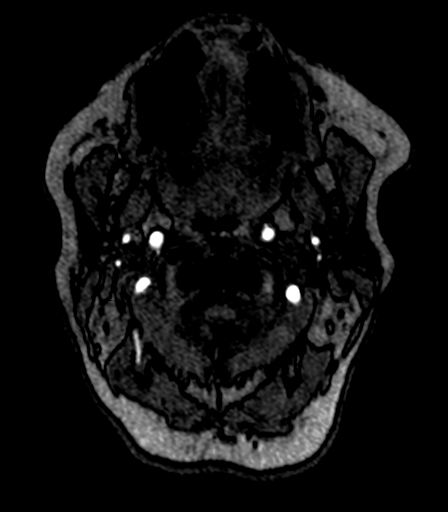
[im 30/162]
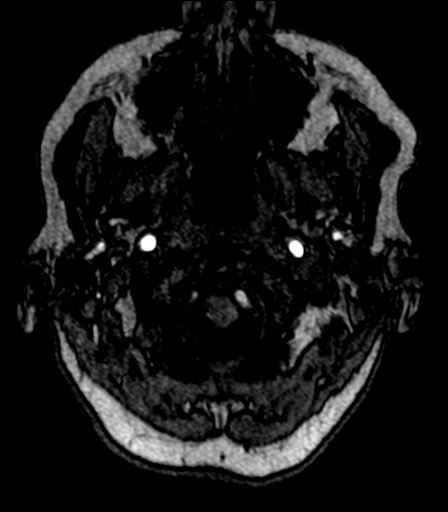
[im 44/162]
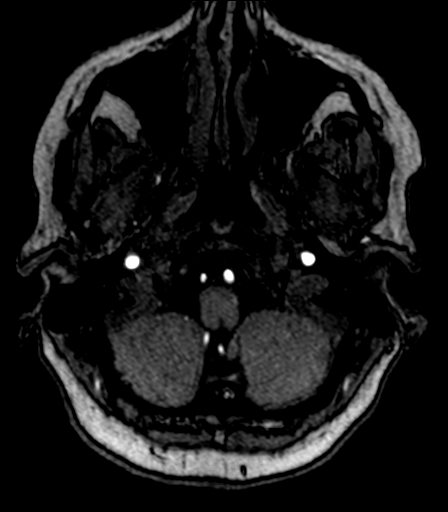
[im 74/162]
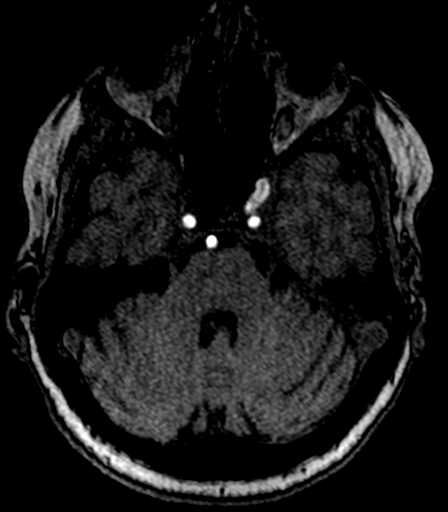
[im 88/162]
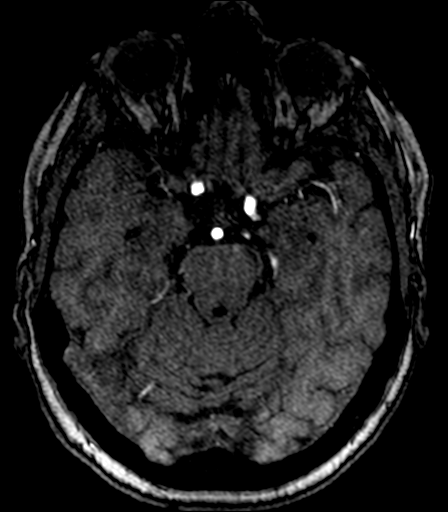
[im 118/162]
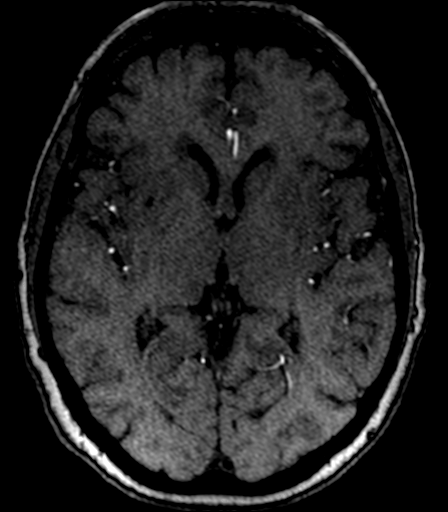
[im 132/162]
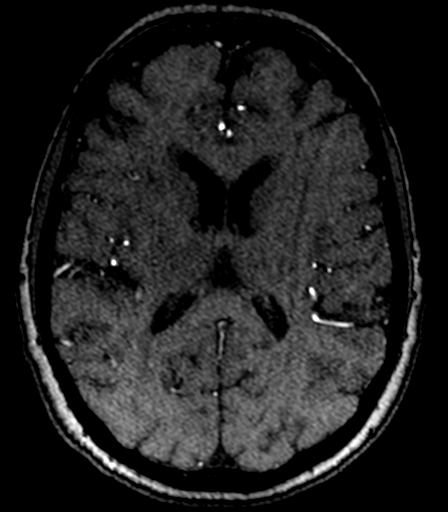
[im 162/162]
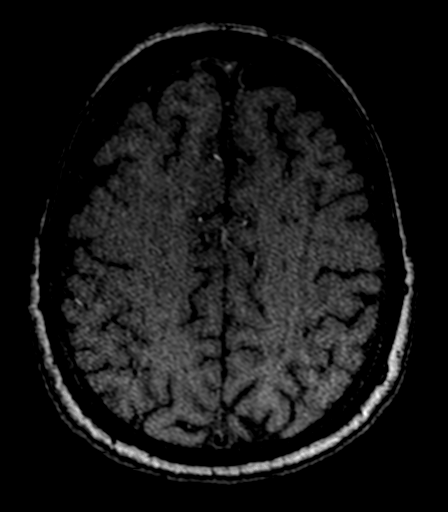

[Series 12: DWI · coronal · 5.0mm · 1.80mm/px · 5 of 68 slices shown (3 of 4)]
[im 1/68]
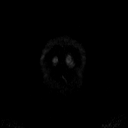
[im 17/68]
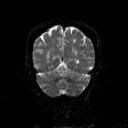
[im 34/68]
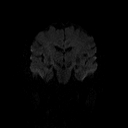
[im 51/68]
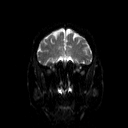
[im 68/68]
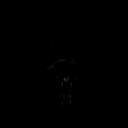

[Series 13: DWI · coronal · 5.0mm · 1.80mm/px · 2 of 34 slices shown (4 of 4)]
[im 1/34]
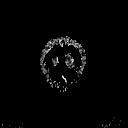
[im 34/34]
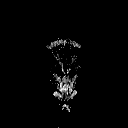

[Series 17: T2 · axial · 5.0mm · 0.51mm/px · z∈[-15,+126]mm · 2 of 22 slices shown (1 of 2)]
[im 1/22]
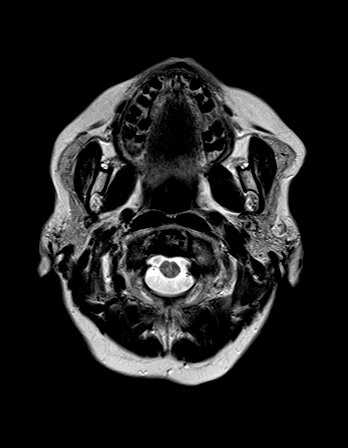
[im 22/22]
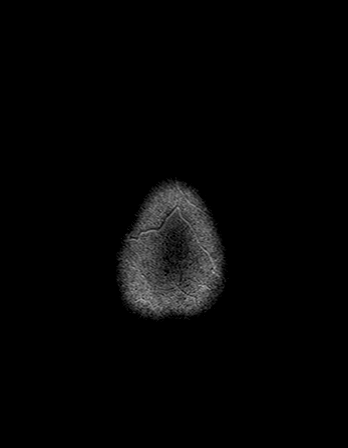

[Series 18: FLAIR · axial · 5.0mm · 0.45mm/px · z∈[-16,+126]mm · 2 of 22 slices shown]
[im 1/22]
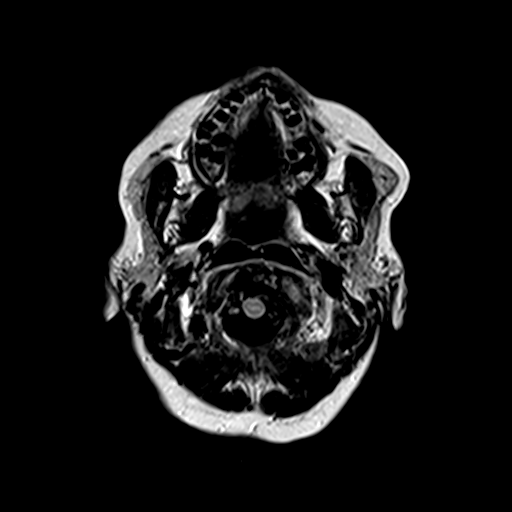
[im 22/22]
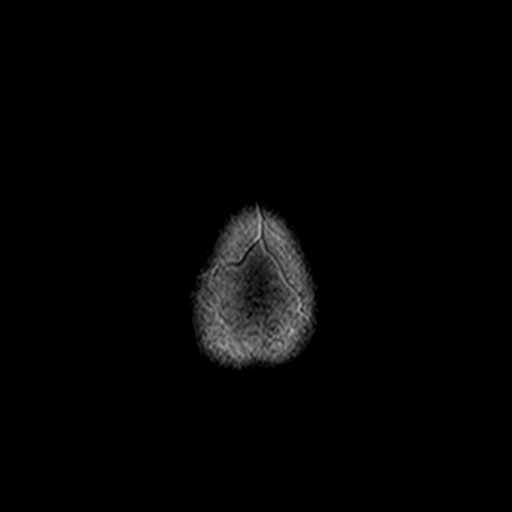

[Series 23: t1_mpr_tra · axial · 2.0mm · 0.45mm/px · z∈[-23,+7]mm · 2 of 80 slices shown]
[im 1/80]
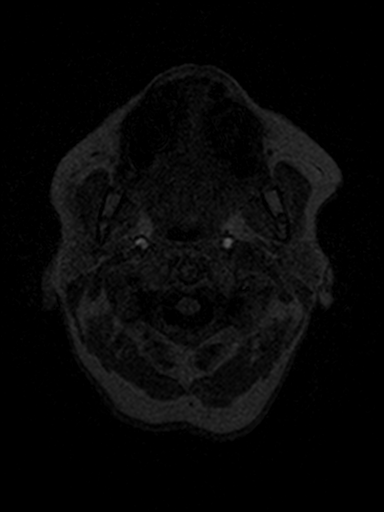
[im 16/80]
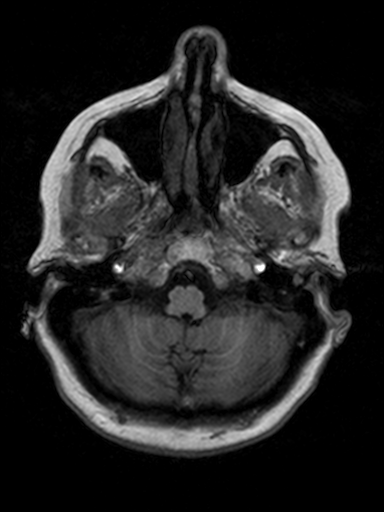

[Series 24: T2 · coronal · 5.0mm · 0.45mm/px · 2 of 25 slices shown (2 of 2)]
[im 1/25]
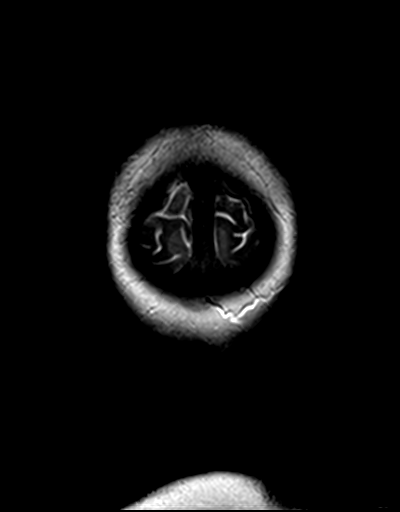
[im 25/25]
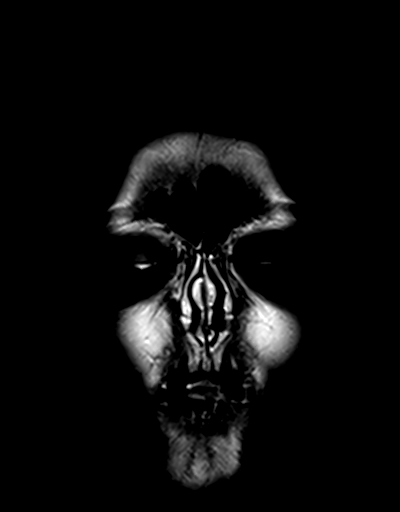

[40 of 48 positions shown; findings below may reference images not displayed]

FINDINGS: MRI HEAD FINDINGS

A 10 mm acute/ subacute infarct involves the splenium of the corpus
callosum on the left. T2 signal there is noted with this lesion.

Moderate diffuse periventricular and scattered subcortical white
matter changes are and noted additionally. Dilated perivascular
spaces are present. There are remote lacunar infarcts within the
thalami bilaterally and within the left caudate head.

White matter changes extend into the brainstem. A remote lacunar
infarct is present in the cerebellum bilaterally.

Flow is present in the major intracranial arteries. The globes and
orbits are intact.

The skullbase is within normal limits. Midline sagittal images are
unremarkable.

MRA HEAD FINDINGS

Mild atherosclerotic changes are noted within the cavernous internal
carotid arteries bilaterally, worse on the left. There is no focal
stenosis relative to the more distal vessel. There is mild
atherosclerotic irregularity in the distal A1 and M1 segments
bilaterally as well without a significant stenosis. Moderate medium
and distal small vessel attenuation is present within the MCA branch
vessels bilaterally. There is a moderate stenosis in the proximal
left A2 segment.

The left vertebral artery is the dominant vessel. The left PICA
origin is visualized and normal. Moderate stenosis is present in the
right vertebral artery including the area of the PICA origin. The
PICA origin is not well seen. The basilar artery is normal. Both
posterior cerebral arteries originate from basilar tip. Moderate
attenuation of PCA branch vessels is present bilaterally.
IMPRESSION: 1. 10 mm acute/subacute infarct involving the left side of the
splenium of corpus callosum.
2. Moderate diffuse periventricular and subcortical white matter
disease bilaterally compatible with chronic microvascular ischemic
change.
3. Mild atherosclerotic changes within the left cavernous internal
carotid artery and distal A1 segments.
4. Moderate medium and distal small vessel disease throughout the
MCA branch vessels bilaterally.
5. Moderate proximal left A2 segment stenosis.
6. Moderate stenosis in the right vertebral artery proximal to the
vertebrobasilar junction. The left vertebral artery is the dominant
vessel.
These results will be called to the ordering clinician or
representative by the Radiologist Assistant, and communication
documented in the PACS or zVision Dashboard.

## 2016-11-30 DIAGNOSIS — N3000 Acute cystitis without hematuria: Secondary | ICD-10-CM | POA: Diagnosis not present

## 2016-11-30 DIAGNOSIS — R300 Dysuria: Secondary | ICD-10-CM | POA: Diagnosis not present

## 2016-12-09 ENCOUNTER — Ambulatory Visit: Payer: PPO | Admitting: Neurology

## 2017-02-06 MED FILL — CLOPIDOGREL 75 MG TABLET: 75 | 90 days supply | Qty: 90 | Fill #1 | Status: TO

## 2017-07-06 DIAGNOSIS — L700 Acne vulgaris: Secondary | ICD-10-CM | POA: Diagnosis not present

## 2017-07-20 ENCOUNTER — Other Ambulatory Visit: Payer: Self-pay | Admitting: Family Medicine

## 2017-07-20 DIAGNOSIS — Z139 Encounter for screening, unspecified: Secondary | ICD-10-CM

## 2017-08-24 ENCOUNTER — Ambulatory Visit: Payer: PPO

## 2017-08-31 ENCOUNTER — Ambulatory Visit
Admission: RE | Admit: 2017-08-31 | Discharge: 2017-08-31 | Disposition: A | Discharge status: Home / Self Care | Payer: PPO | Source: Ambulatory Visit | Attending: Family Medicine | Admitting: Family Medicine

## 2017-08-31 DIAGNOSIS — Z1231 Encounter for screening mammogram for malignant neoplasm of breast: Secondary | ICD-10-CM | POA: Diagnosis not present

## 2017-08-31 DIAGNOSIS — Z139 Encounter for screening, unspecified: Secondary | ICD-10-CM

## 2017-09-14 ENCOUNTER — Other Ambulatory Visit: Payer: Self-pay | Admitting: Family Medicine

## 2017-09-14 DIAGNOSIS — Z0000 Encounter for general adult medical examination without abnormal findings: Secondary | ICD-10-CM | POA: Diagnosis not present

## 2017-09-14 DIAGNOSIS — M8580 Other specified disorders of bone density and structure, unspecified site: Secondary | ICD-10-CM | POA: Diagnosis not present

## 2017-09-14 DIAGNOSIS — N183 Chronic kidney disease, stage 3 (moderate): Secondary | ICD-10-CM | POA: Diagnosis not present

## 2017-09-14 DIAGNOSIS — G450 Vertebro-basilar artery syndrome: Secondary | ICD-10-CM | POA: Diagnosis not present

## 2017-09-14 DIAGNOSIS — E7800 Pure hypercholesterolemia, unspecified: Secondary | ICD-10-CM | POA: Diagnosis not present

## 2017-09-14 DIAGNOSIS — L309 Dermatitis, unspecified: Secondary | ICD-10-CM | POA: Diagnosis not present

## 2017-09-14 DIAGNOSIS — I6523 Occlusion and stenosis of bilateral carotid arteries: Secondary | ICD-10-CM | POA: Diagnosis not present

## 2017-09-14 DIAGNOSIS — E559 Vitamin D deficiency, unspecified: Secondary | ICD-10-CM | POA: Diagnosis not present

## 2017-10-27 ENCOUNTER — Ambulatory Visit
Admission: RE | Admit: 2017-10-27 | Discharge: 2017-10-27 | Disposition: A | Discharge status: Home / Self Care | Payer: PPO | Source: Ambulatory Visit | Attending: Family Medicine | Admitting: Family Medicine

## 2017-10-27 DIAGNOSIS — M8589 Other specified disorders of bone density and structure, multiple sites: Secondary | ICD-10-CM | POA: Diagnosis not present

## 2017-10-27 DIAGNOSIS — Z780 Asymptomatic menopausal state: Secondary | ICD-10-CM | POA: Diagnosis not present

## 2017-10-27 DIAGNOSIS — M8580 Other specified disorders of bone density and structure, unspecified site: Secondary | ICD-10-CM

## 2018-01-05 DIAGNOSIS — R300 Dysuria: Secondary | ICD-10-CM | POA: Diagnosis not present

## 2018-02-14 DIAGNOSIS — L0100 Impetigo, unspecified: Secondary | ICD-10-CM | POA: Diagnosis not present

## 2018-02-14 DIAGNOSIS — N183 Chronic kidney disease, stage 3 (moderate): Secondary | ICD-10-CM | POA: Diagnosis not present

## 2018-02-14 DIAGNOSIS — L439 Lichen planus, unspecified: Secondary | ICD-10-CM | POA: Diagnosis not present

## 2018-03-02 DIAGNOSIS — Z86010 Personal history of colonic polyps: Secondary | ICD-10-CM | POA: Diagnosis not present

## 2018-03-02 DIAGNOSIS — K5730 Diverticulosis of large intestine without perforation or abscess without bleeding: Secondary | ICD-10-CM | POA: Diagnosis not present

## 2018-04-30 DIAGNOSIS — L989 Disorder of the skin and subcutaneous tissue, unspecified: Secondary | ICD-10-CM | POA: Diagnosis not present

## 2018-05-02 ENCOUNTER — Other Ambulatory Visit: Payer: Self-pay | Admitting: Podiatry

## 2018-05-02 ENCOUNTER — Telehealth: Payer: Self-pay | Admitting: *Deleted

## 2018-05-02 ENCOUNTER — Ambulatory Visit (INDEPENDENT_AMBULATORY_CARE_PROVIDER_SITE_OTHER): Payer: PPO

## 2018-05-02 ENCOUNTER — Encounter: Payer: Self-pay | Admitting: Podiatry

## 2018-05-02 ENCOUNTER — Ambulatory Visit: Payer: PPO | Admitting: Podiatry

## 2018-05-02 VITALS — BP 191/98 | HR 85

## 2018-05-02 DIAGNOSIS — M722 Plantar fascial fibromatosis: Secondary | ICD-10-CM

## 2018-05-02 DIAGNOSIS — M79672 Pain in left foot: Secondary | ICD-10-CM

## 2018-05-02 NOTE — Patient Instructions (Signed)
Pre-Operative Instructions  Congratulations, you have decided to take an important step towards improving your quality of life.  You can be assured that the doctors and staff at [facility] will be with you every step of the way.  Here are some important things you should know:  1. Plan to be at the surgery center/hospital at least 1 (one) hour prior to your scheduled time, unless otherwise directed by the surgical center/hospital staff.  You must have a responsible adult accompany you, remain during the surgery and drive you home.  Make sure you have directions to the surgical center/hospital to ensure you arrive on time. 2. If you are having surgery at [facility] or [facility] hospitals, you will need a copy of your medical history and physical form from your family physician within one month prior to the date of surgery. We will give you a form for your primary physician to complete.  3. We make every effort to accommodate the date you request for surgery.  However, there are times where surgery dates or times have to be moved.  We will contact you as soon as possible if a change in schedule is required.   4. No aspirin/ibuprofen for one week before surgery.  If you are on aspirin, any non-steroidal anti-inflammatory medications (Mobic, Aleve, Ibuprofen) should not be taken seven (7) days prior to your surgery.  You make take Tylenol for pain prior to surgery.  5. Medications - If you are taking daily heart and blood pressure medications, seizure, reflux, allergy, asthma, anxiety, pain or diabetes medications, make sure you notify the surgery center/hospital before the day of surgery so they can tell you which medications you should take or avoid the day of surgery. 6. No food or drink after midnight the night before surgery unless directed otherwise by surgical center/hospital staff. 7. No alcoholic beverages 24-hours prior to surgery.  No smoking 24-hours prior or 24-hours after  surgery. 8. Wear loose pants or shorts. They should be loose enough to fit over bandages, boots, and casts. 9. Don't wear slip-on shoes. Sneakers are preferred. 10. Bring your boot with you to the surgery center/hospital.  Also bring crutches or a walker if your physician has prescribed it for you.  If you do not have this equipment, it will be provided for you after surgery. 11. If you have not been contacted by the surgery center/hospital by the day before your surgery, call to confirm the date and time of your surgery. 12. Leave-time from work may vary depending on the type of surgery you have.  Appropriate arrangements should be made prior to surgery with your employer. 13. Prescriptions will be provided immediately following surgery by your doctor.  Fill these as soon as possible after surgery and take the medication as directed. Pain medications will not be refilled on weekends and must be approved by the doctor. 14. Remove nail polish on the operative foot and avoid getting pedicures prior to surgery. 15. Wash the night before surgery.  The night before surgery wash the foot and leg well with water and the antibacterial soap provided. Be sure to pay special attention to beneath the toenails and in between the toes.  Wash for at least three (3) minutes. Rinse thoroughly with water and dry well with a towel.  Perform this wash unless told not to do so by your physician.  Enclosed: 1 Ice pack (please put in freezer the night before surgery)   1 Hibiclens skin cleaner     Pre-op instructions  If you have any questions regarding the instructions, please do not hesitate to call our office.  [place]: [address], [place], [place] [address] -- [phone]  [place]: [address] [place], [place] [address] -- [phone]  [place]: [address]  [place], [place] [address] -- [phone]  [place]: [address], [address], [place], [place] [address] -- [phone]  Website:  [email or web address] 

## 2018-05-02 NOTE — Telephone Encounter (Signed)
"  My name is Laura Rush.  I was there today and I scheduled surgery for January 29.  I got home and realized I can't do it that day.  I can do it on January 27."  Dr. Amalia Hailey can't do it that day.  He can do it on February 5.  "Okay, that day will be fine."  I'll get it rescheduled.

## 2018-05-03 ENCOUNTER — Telehealth: Payer: Self-pay | Admitting: *Deleted

## 2018-05-03 NOTE — Telephone Encounter (Signed)
"  I'm calling for my wife, Laura Rush.  We called yesterday and rescheduled her surgery to February 5 from January 29.  Her foot is killing her.  Is there any way possible that we can reschedule her surgery back to January 29?"  Yes, I'll get it rescheduled.  "Thank you so much.  What time will she need to be there?"  She'll need to be here at 7:45 am.

## 2018-05-06 NOTE — Progress Notes (Signed)
   HPI: 78 year old female presenting today as a new patient with a chief complaint of pain to the plantar midfoot that began a few months ago. She thought it was a plantar wart but her PCP believes it may be a cyst. She has not had any treatment for the symptoms. Walking increases the pain. Patient is here for further evaluation and treatment.   Past Medical History:  Diagnosis Date  . Arthritis   . HOH (hard of hearing), left   . Hyperlipidemia   . 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 Path confirmed papilloma   . No pertinent past medical history   . Papilloma of breast       Physical Exam: General: The patient is alert and oriented x3 in no acute distress.  Dermatology: Skin is warm, dry and supple bilateral lower extremities. Negative for open lesions or macerations.  Vascular: Palpable pedal pulses bilaterally. No edema or erythema noted. Capillary refill within normal limits.  Neurological: Epicritic and protective threshold grossly intact bilaterally.   Musculoskeletal Exam: Palpable nodule noted to the plantar medial longitudinal arch of the left foot. Pain with palpation also noted to the area. Range of motion within normal limits to all pedal and ankle joints bilateral. Muscle strength 5/5 in all groups bilateral.   Radiographic Exam:  Normal osseous mineralization. Joint spaces preserved. No fracture/dislocation/boney destruction.    Assessment: 1. plantar fibroma left   Plan of Care:  1. Patient evaluated. X-Rays reviewed.  2. Today we discussed the conservative versus surgical management of the presenting pathology. The patient opts for surgical management. All possible complications and details of the procedure were explained. All patient questions were answered. No guarantees were expressed or implied. 3. Authorization for surgery was initiated today. Surgery will consist of excision of plantar fibroma left.  4. Recommended  stopping Plavix 5 days prior to surgery then immediately resume taking it.  5. Return to clinic on the morning of surgery in the office.     Edrick Kins, DPM [facility]  Dr. Edrick Kins, DPM    [address], [place] [address]                Office [phone]  Fax [phone]

## 2018-05-23 ENCOUNTER — Encounter: Payer: Self-pay | Admitting: Podiatry

## 2018-05-23 ENCOUNTER — Ambulatory Visit (INDEPENDENT_AMBULATORY_CARE_PROVIDER_SITE_OTHER): Payer: PPO | Admitting: Podiatry

## 2018-05-23 ENCOUNTER — Other Ambulatory Visit: Payer: Self-pay | Admitting: Podiatry

## 2018-05-23 VITALS — Temp 96.6°F

## 2018-05-23 DIAGNOSIS — M722 Plantar fascial fibromatosis: Secondary | ICD-10-CM

## 2018-05-23 DIAGNOSIS — M7989 Other specified soft tissue disorders: Secondary | ICD-10-CM | POA: Diagnosis not present

## 2018-05-24 ENCOUNTER — Telehealth: Payer: Self-pay | Admitting: *Deleted

## 2018-05-24 NOTE — Telephone Encounter (Signed)
Laura Rush - [facility] states they have received a tissue pathology and the requisition states pathology should be sent to [facility], please advise.

## 2018-05-28 LAB — PATHOLOGY

## 2018-05-28 LAB — TISSUE SPECIMEN

## 2018-05-30 ENCOUNTER — Ambulatory Visit (INDEPENDENT_AMBULATORY_CARE_PROVIDER_SITE_OTHER): Payer: PPO | Admitting: Podiatry

## 2018-05-30 DIAGNOSIS — M7989 Other specified soft tissue disorders: Secondary | ICD-10-CM

## 2018-05-30 DIAGNOSIS — M722 Plantar fascial fibromatosis: Secondary | ICD-10-CM

## 2018-05-30 DIAGNOSIS — Z09 Encounter for follow-up examination after completed treatment for conditions other than malignant neoplasm: Secondary | ICD-10-CM

## 2018-06-03 NOTE — Progress Notes (Signed)
   OPERATIVE REPORT Patient name: Laura Rush MRN: [id number] DOB: 1941/01/25  DOS:  06/03/18  Preop Dx: Plantar fibroma left Postop Dx: same  Procedure:  1.  Excision of plantar fibroma left  Surgeon: Edrick Kins DPM  Anesthesia: 50-50 mixture of 2% lidocaine plain with 0.5% Marcaine plain totaling 3 cc infiltrated in the patient's left lower extremity  Hemostasis: Ankle tourniquet inflated to a pressure of 273mmHg after esmarch exsanguination   EBL: Minimal mL Materials: None Injectables: None Pathology: Soft tissue plantar fibroma left  Condition: The patient tolerated the procedure and anesthesia well. No complications noted or reported   Justification for procedure: The patient is a 78 y.o. female who presents today for surgical correction of a symptomatic plantar fibroma to the left foot.. All conservative modalities of been unsuccessful in providing any sort of satisfactory alleviation of symptoms with the patient. The patient was told benefits as well as possible side effects of the surgery. The patient consented for surgical correction. The patient consent form was reviewed. All patient questions were answered. No guarantees were expressed or implied. The patient and the surgeon both signed the patient consent form with the witness present and placed in the patient's chart.   Procedure in Detail: The patient was brought to the operating room, placed in the operating table in the supine position at which time an aseptic scrub and drape were performed about the patient's respective lower extremity after anesthesia was induced as described above. Attention was then directed to the surgical area where procedure number one commenced.  Procedure #1: Excision of plantar fibroma left foot A 2 cm elliptical type skin incision was planned and made overlying the palpable soft tissue plantar fibroma of the left foot.  The plantar fibroma was removed in toto using a surgical #15  blade with care taken to cut clamp ligate and retract away all small neurovascular structures traversing the incision site.  Intraoperative evaluation of the plantar fibroma was performed to ensure that all margins remaining were free from any fibrotic tissue which was satisfactory.  The incision site was then copiously irrigated with normal saline and dried in preparation for primary closure.  4-0 nylon suture was utilized to reapproximate superficial skin edges of the skin incision.  Dry sterile compressive dressings were then applied to all previously mentioned incision sites about the patient's lower extremity. The tourniquet which was used for hemostasis was deflated. All normal neurovascular responses including pink color and warmth returned all the digits of patient's lower extremity.  Patient was then discharged from the office with prescriptions for adequate analgesia.  Verbal as well as written instructions were provided for the patient regarding wound care. The patient is to keep the dressings clean dry and intact until they are to follow surgeon Dr. Daylene Katayama in the office 1 week postop.  Edrick Kins, DPM [facility]  Dr. Edrick Kins, [address]                                        [place], [place] [address]                Office [phone]  Fax [phone]

## 2018-06-03 NOTE — Progress Notes (Signed)
   Subjective:  Patient presents today status post excision of plantar fibroma left. DOS: 05/23/2018. She reports some tenderness around the incision site that is tolerable. She has been taking Tylenol for treatment. Touching the area increases the pain. Patient is here for further evaluation and treatment.    Past Medical History:  Diagnosis Date  . Arthritis   . HOH (hard of hearing), left   . Hyperlipidemia   . 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 Path confirmed papilloma   . No pertinent past medical history   . Papilloma of breast       Objective/Physical Exam Neurovascular status intact.  Skin incisions appear to be well coapted with sutures and staples intact. No sign of infectious process noted. No dehiscence. No active bleeding noted. Moderate edema noted to the surgical extremity.  Assessment: 1. s/p excision of plantar fibroma left. DOS: 05/23/2018   Plan of Care:  1. Patient was evaluated.  2. Dressing changed.  3. Recommended antibiotic ointment daily with a bandage.  4. Discontinue using post op shoe.  5. Return to clinic in one week for suture removal.    Edrick Kins, DPM [facility]  Dr. Edrick Kins, [address]                                        [place], [place] [address]                Office [phone]  Fax [phone]

## 2018-06-11 ENCOUNTER — Encounter: Payer: Self-pay | Admitting: Podiatry

## 2018-06-11 ENCOUNTER — Ambulatory Visit (INDEPENDENT_AMBULATORY_CARE_PROVIDER_SITE_OTHER): Payer: PPO | Admitting: Podiatry

## 2018-06-11 DIAGNOSIS — M7989 Other specified soft tissue disorders: Secondary | ICD-10-CM | POA: Diagnosis not present

## 2018-06-11 DIAGNOSIS — M722 Plantar fascial fibromatosis: Secondary | ICD-10-CM

## 2018-06-11 DIAGNOSIS — Z09 Encounter for follow-up examination after completed treatment for conditions other than malignant neoplasm: Secondary | ICD-10-CM

## 2018-06-13 NOTE — Progress Notes (Signed)
Subjective:   Patient ID: Laura Rush, female   DOB: 78 y.o.   MRN: [id number]   HPI Patient states I am having a little bit of pain still in my left arch that I wanted checked and I need the stitches removed   ROS      Objective:  Physical Exam  Neurovascular status intact with well-healing surgical site plantar left arch secondary to what appears to be fibroma excision with minimal redness around the area and no drainage or proximal edema erythema drainage noted and negative Homans sign     Assessment:  Well-healing surgical site left with patient still having moderate pain but she is been very active on this and is currently been wearing tennis shoes     Plan:  Stitches are removed wound edges well coapted dressing applied along with ankle compression stocking.  I advised that with the discomfort it would probably be best to go back into the boot for several more weeks and she will be reevaluated by Dr. Amalia Hailey in approximately 1 to 2 weeks and is encouraged to call with questions concerns

## 2018-06-25 ENCOUNTER — Encounter: Payer: Self-pay | Admitting: Podiatry

## 2018-06-25 ENCOUNTER — Ambulatory Visit (INDEPENDENT_AMBULATORY_CARE_PROVIDER_SITE_OTHER): Payer: PPO | Admitting: Podiatry

## 2018-06-25 DIAGNOSIS — Z09 Encounter for follow-up examination after completed treatment for conditions other than malignant neoplasm: Secondary | ICD-10-CM

## 2018-06-25 DIAGNOSIS — M722 Plantar fascial fibromatosis: Secondary | ICD-10-CM

## 2018-06-27 NOTE — Progress Notes (Signed)
   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 DOS: 05/23/2018. She states she is improving. She reports some mild tenderness. Wearing regular shoes increases the pain. She has been using the CAM boot to alleviate the symptoms. Patient is here for further evaluation and treatment.    Past Medical History:  Diagnosis Date  . Arthritis   . HOH (hard of hearing), left   . Hyperlipidemia   . 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 Path confirmed papilloma   . No pertinent past medical history   . Papilloma of breast       Objective/Physical Exam Neurovascular status intact.  Skin incisions appear to be well coapted. No sign of infectious process noted. No dehiscence. No active bleeding noted. Moderate edema noted to the surgical extremity.  Assessment: 1. s/p excision of fibroma of the left foot. DOS: 05/23/2018   Plan of Care:  1. Patient was evaluated.  2. Dry callus tissue removed around re-incision with a tissue nipper.  3. Recommended good shoe gear.  4. Return to clinic in 4 weeks.    Edrick Kins, DPM [facility]  Dr. Edrick Kins, [address]                                        [place], [place] [address]                Office [phone]  Fax [phone]

## 2018-07-23 ENCOUNTER — Other Ambulatory Visit: Payer: Self-pay

## 2018-07-23 ENCOUNTER — Encounter: Payer: Self-pay | Admitting: Podiatry

## 2018-07-23 ENCOUNTER — Other Ambulatory Visit: Payer: Self-pay | Admitting: Family Medicine

## 2018-07-23 ENCOUNTER — Ambulatory Visit (INDEPENDENT_AMBULATORY_CARE_PROVIDER_SITE_OTHER): Payer: PPO | Admitting: Podiatry

## 2018-07-23 VITALS — BP 173/108 | HR 74 | Temp 97.6°F | Resp 16

## 2018-07-23 DIAGNOSIS — Z09 Encounter for follow-up examination after completed treatment for conditions other than malignant neoplasm: Secondary | ICD-10-CM

## 2018-07-23 DIAGNOSIS — Z1231 Encounter for screening mammogram for malignant neoplasm of breast: Secondary | ICD-10-CM

## 2018-07-23 DIAGNOSIS — M722 Plantar fascial fibromatosis: Secondary | ICD-10-CM

## 2018-07-23 DIAGNOSIS — M7989 Other specified soft tissue disorders: Secondary | ICD-10-CM

## 2018-07-23 NOTE — Progress Notes (Signed)
   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 DOS: 05/23/2018.  Patient denies pain.  She has no new complaints at this time.  She has been weightbearing and walking in her tennis shoes without any symptoms.  She is also been massaging the incision site with cocoa butter which she states helped significantly    Past Medical History:  Diagnosis Date  . Arthritis   . HOH (hard of hearing), left   . Hyperlipidemia   . 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 Path confirmed papilloma   . No pertinent past medical history   . Papilloma of breast       Objective/Physical Exam Neurovascular status intact.  Skin incisions appear to be well coapted. No sign of infectious process noted. No dehiscence. No active bleeding noted. Moderate edema noted to the surgical extremity.  There is some scar tissue noted along the incision site however there is no pain with palpation.  Assessment: 1. s/p excision of fibroma of the left foot. DOS: 05/23/2018   Plan of Care:  1. Patient was evaluated.  2.  Continue lotion and deep tissue massage to break up any scar tissue adhesions 3.  Continue wearing good supportive shoe gear 4.  Full activity no restrictions 5.  Return to clinic as needed  Edrick Kins, DPM [facility]  Dr. Edrick Kins, [address]                                        [place], [place] [address]                Office [phone]  Fax [phone]

## 2018-09-12 ENCOUNTER — Other Ambulatory Visit: Payer: Self-pay

## 2018-09-12 ENCOUNTER — Ambulatory Visit
Admission: RE | Admit: 2018-09-12 | Discharge: 2018-09-12 | Disposition: A | Discharge status: Home / Self Care | Payer: PPO | Source: Ambulatory Visit | Attending: Family Medicine | Admitting: Family Medicine

## 2018-09-12 DIAGNOSIS — Z1231 Encounter for screening mammogram for malignant neoplasm of breast: Secondary | ICD-10-CM | POA: Diagnosis not present

## 2018-09-19 DIAGNOSIS — Z0000 Encounter for general adult medical examination without abnormal findings: Secondary | ICD-10-CM | POA: Diagnosis not present

## 2018-09-19 DIAGNOSIS — G450 Vertebro-basilar artery syndrome: Secondary | ICD-10-CM | POA: Diagnosis not present

## 2018-09-19 DIAGNOSIS — L439 Lichen planus, unspecified: Secondary | ICD-10-CM | POA: Diagnosis not present

## 2018-09-19 DIAGNOSIS — N183 Chronic kidney disease, stage 3 (moderate): Secondary | ICD-10-CM | POA: Diagnosis not present

## 2018-09-19 DIAGNOSIS — E7800 Pure hypercholesterolemia, unspecified: Secondary | ICD-10-CM | POA: Diagnosis not present

## 2018-09-19 DIAGNOSIS — I6523 Occlusion and stenosis of bilateral carotid arteries: Secondary | ICD-10-CM | POA: Diagnosis not present

## 2018-09-19 DIAGNOSIS — M8580 Other specified disorders of bone density and structure, unspecified site: Secondary | ICD-10-CM | POA: Diagnosis not present

## 2018-09-19 DIAGNOSIS — E559 Vitamin D deficiency, unspecified: Secondary | ICD-10-CM | POA: Diagnosis not present

## 2018-09-20 ENCOUNTER — Ambulatory Visit: Payer: PPO

## 2018-09-25 ENCOUNTER — Other Ambulatory Visit: Payer: Self-pay | Admitting: Family Medicine

## 2018-09-25 DIAGNOSIS — I6523 Occlusion and stenosis of bilateral carotid arteries: Secondary | ICD-10-CM

## 2018-09-26 ENCOUNTER — Ambulatory Visit
Admission: RE | Admit: 2018-09-26 | Discharge: 2018-09-26 | Disposition: A | Discharge status: Home / Self Care | Payer: PPO | Source: Ambulatory Visit | Attending: Family Medicine | Admitting: Family Medicine

## 2018-09-26 DIAGNOSIS — I6523 Occlusion and stenosis of bilateral carotid arteries: Secondary | ICD-10-CM

## 2018-09-27 DIAGNOSIS — L433 Subacute (active) lichen planus: Secondary | ICD-10-CM | POA: Diagnosis not present

## 2018-10-18 DIAGNOSIS — E7800 Pure hypercholesterolemia, unspecified: Secondary | ICD-10-CM | POA: Diagnosis not present

## 2018-10-18 DIAGNOSIS — E559 Vitamin D deficiency, unspecified: Secondary | ICD-10-CM | POA: Diagnosis not present

## 2018-10-18 DIAGNOSIS — N183 Chronic kidney disease, stage 3 (moderate): Secondary | ICD-10-CM | POA: Diagnosis not present

## 2018-12-28 DIAGNOSIS — L433 Subacute (active) lichen planus: Secondary | ICD-10-CM | POA: Diagnosis not present

## 2019-03-20 DIAGNOSIS — N183 Chronic kidney disease, stage 3 unspecified: Secondary | ICD-10-CM | POA: Diagnosis not present

## 2019-08-08 ENCOUNTER — Other Ambulatory Visit: Payer: Self-pay | Admitting: Family Medicine

## 2019-08-08 DIAGNOSIS — Z1231 Encounter for screening mammogram for malignant neoplasm of breast: Secondary | ICD-10-CM

## 2019-09-13 ENCOUNTER — Ambulatory Visit
Admission: RE | Admit: 2019-09-13 | Discharge: 2019-09-13 | Disposition: A | Discharge status: Home / Self Care | Payer: PPO | Source: Ambulatory Visit | Attending: Family Medicine | Admitting: Family Medicine

## 2019-09-13 ENCOUNTER — Other Ambulatory Visit: Payer: Self-pay

## 2019-09-13 DIAGNOSIS — Z1231 Encounter for screening mammogram for malignant neoplasm of breast: Secondary | ICD-10-CM

## 2019-09-20 ENCOUNTER — Other Ambulatory Visit: Payer: Self-pay | Admitting: Family Medicine

## 2019-09-20 DIAGNOSIS — Z0000 Encounter for general adult medical examination without abnormal findings: Secondary | ICD-10-CM | POA: Diagnosis not present

## 2019-09-20 DIAGNOSIS — I6523 Occlusion and stenosis of bilateral carotid arteries: Secondary | ICD-10-CM | POA: Diagnosis not present

## 2019-09-20 DIAGNOSIS — G450 Vertebro-basilar artery syndrome: Secondary | ICD-10-CM | POA: Diagnosis not present

## 2019-09-20 DIAGNOSIS — N183 Chronic kidney disease, stage 3 unspecified: Secondary | ICD-10-CM | POA: Diagnosis not present

## 2019-09-20 DIAGNOSIS — M8580 Other specified disorders of bone density and structure, unspecified site: Secondary | ICD-10-CM | POA: Diagnosis not present

## 2019-09-20 DIAGNOSIS — E7800 Pure hypercholesterolemia, unspecified: Secondary | ICD-10-CM | POA: Diagnosis not present

## 2019-12-26 DIAGNOSIS — R739 Hyperglycemia, unspecified: Secondary | ICD-10-CM | POA: Diagnosis not present

## 2019-12-26 DIAGNOSIS — Z131 Encounter for screening for diabetes mellitus: Secondary | ICD-10-CM | POA: Diagnosis not present

## 2019-12-27 ENCOUNTER — Other Ambulatory Visit: Payer: PPO

## 2020-02-08 ENCOUNTER — Ambulatory Visit: Payer: PPO | Attending: Internal Medicine

## 2020-02-08 ENCOUNTER — Other Ambulatory Visit: Payer: Self-pay

## 2020-02-08 DIAGNOSIS — Z23 Encounter for immunization: Secondary | ICD-10-CM

## 2020-02-08 NOTE — Progress Notes (Signed)
   Covid-19 Vaccination Clinic  Name:  Laura Rush    MRN: [id number] DOB: [birth year]  02/08/2020  [name] was observed post Covid-19 immunization for 15 minutes without incident. She was provided with Vaccine Information Sheet and instruction to access the V-Safe system.   [name] was instructed to call 911 with any severe reactions post vaccine: Marland Kitchen Difficulty breathing  . Swelling of face and throat  . A fast heartbeat  . A bad rash all over body  . Dizziness and weakness

## 2020-03-23 ENCOUNTER — Other Ambulatory Visit: Payer: Self-pay

## 2020-03-23 ENCOUNTER — Ambulatory Visit: Payer: PPO | Admitting: Podiatry

## 2020-03-23 ENCOUNTER — Ambulatory Visit (INDEPENDENT_AMBULATORY_CARE_PROVIDER_SITE_OTHER): Payer: PPO

## 2020-03-23 DIAGNOSIS — M7752 Other enthesopathy of left foot: Secondary | ICD-10-CM

## 2020-03-23 MED ORDER — METHYLPREDNISOLONE 4 MG PO TBPK: ORAL_TABLET | ORAL | 0 refills | Status: AC

## 2020-03-23 NOTE — Progress Notes (Signed)
   HPI: 79 y.o. female presenting today with a new complaint associated to left fifth toe pain and swelling has been going on for approximately 2 weeks now.  Patient states that she woke up in the morning with left fifth toe pain approximately 2 weeks ago.  She denies injury.  She has been wearing a surgical shoe that she has at home with minimal relief.  She presents for further treatment and evaluation  Past Medical History:  Diagnosis Date  . Arthritis   . HOH (hard of hearing), left   . Hyperlipidemia   . 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 Path confirmed papilloma   . No pertinent past medical history   . Papilloma of breast      Physical Exam: General: The patient is alert and oriented x3 in no acute distress.  Dermatology: Skin is warm, dry and supple bilateral lower extremities. Negative for open lesions or macerations.  No open wounds or lesions around the left fifth toe  Vascular: Palpable pedal pulses bilaterally. No edema or erythema noted with exception to the left fifth toe. Capillary refill within normal limits.  Neurological: Epicritic and protective threshold grossly intact bilaterally.   Musculoskeletal Exam: Range of motion within normal limits to all pedal and ankle joints bilateral. Muscle strength 5/5 in all groups bilateral.  There is some erythema with edema to the left fifth toe.  Radiographic Exam:  Normal osseous mineralization. Joint spaces preserved. No fracture/dislocation/boney destruction.  X-rays are negative to the left fifth toe.  No indication of fracture noted.  Joint spaces and toe alignment is preserved.  Assessment: 1.  Capsulitis left fifth toe-dorsal overlying the DIPJ   Plan of Care:  1. Patient evaluated. X-Rays reviewed.  2.  Injection of 0.5 cc Celestone Soluspan injected into the left fifth toe.  I do suspect that the patient sustained an injury possibly although she cannot recall an incident 3.   Prescription for Medrol Dosepak 4.  Continue postsurgical shoe weightbearing as tolerated 5.  Return to clinic in 10 days      Edrick Kins, DPM [facility]  Dr. Edrick Kins, DPM    [address], [place] [address]                Office [phone]  Fax [phone]

## 2020-04-01 ENCOUNTER — Other Ambulatory Visit: Payer: Self-pay

## 2020-04-01 ENCOUNTER — Ambulatory Visit: Payer: PPO | Admitting: Podiatry

## 2020-04-01 DIAGNOSIS — L03039 Cellulitis of unspecified toe: Secondary | ICD-10-CM

## 2020-04-01 DIAGNOSIS — L03032 Cellulitis of left toe: Secondary | ICD-10-CM

## 2020-04-01 MED ORDER — GENTAMICIN SULFATE 0.1 % EX CREA: 1.0000 "application " | TOPICAL_CREAM | Freq: Two times a day (BID) | CUTANEOUS | 1 refills | Status: AC

## 2020-04-01 NOTE — Progress Notes (Signed)
   HPI: 79 y.o. female presenting today for follow-up evaluation regarding pain to the left fifth toe.  Patient continues to have pain and sensitivity to the left fifth toe despite an anti-inflammatory steroidal injection and anti-inflammatory medication.  She continues to wear the postsurgical shoe as instructed.  No new complaints at this time  Past Medical History:  Diagnosis Date  . Arthritis   . HOH (hard of hearing), left   . Hyperlipidemia   . 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 Path confirmed papilloma   . No pertinent past medical history   . Papilloma of breast      Physical Exam: General: The patient is alert and oriented x3 in no acute distress.  Dermatology: Skin is warm, dry and supple bilateral lower extremities. Negative for open lesions or macerations.  Today there is increased erythema and edema around the distal tip of the left fifth toe.  No open wounds.  Vascular: Palpable pedal pulses bilaterally. No edema or erythema noted. Capillary refill within normal limits.  Neurological: Epicritic and protective threshold grossly intact bilaterally.   Musculoskeletal Exam: Range of motion within normal limits to all pedal and ankle joints bilateral. Muscle strength 5/5 in all groups bilateral.  Pain and sensitivity noted to the left fifth toe   Assessment: 1.  Abscess around the left fifth toe nail plate   Plan of Care:  1. Patient evaluated.  2.  The erythema and edema runs around the base of the nail plate of the left fifth toe.  Decision was made to perform temporary total nail avulsion of the nail plate left fifth toe.  Patient agreed.  Prior to avulsion procedure the toe was prepped in aseptic manner and blocked with 3 mL of 2% lidocaine plain 3.  Nail was avulsed in toto and there was purulence expressed from the base of the nail plate. 4.  The toenail bed was cleansed and sterile dressing applied 5.  Prescription for  gentamicin cream to apply daily 6.  Continue the postsurgical shoe for 1 week 7.  Return to clinic in 2 weeks      Edrick Kins, DPM [facility]  Dr. Edrick Kins, DPM    [address], [place] [address]                Office [phone]  Fax [phone]

## 2020-04-02 ENCOUNTER — Telehealth: Payer: Self-pay | Admitting: Podiatry

## 2020-04-02 NOTE — Telephone Encounter (Signed)
Patient has requested antibiotic pill for infection, stating she was told yesterday she would be prescribed pill and creme but when she went to pharmacy they only had prescription for creme, please advise

## 2020-04-09 ENCOUNTER — Other Ambulatory Visit: Payer: PPO
